# Patient Record
Sex: Female | Born: 1990 | Hispanic: No | Marital: Single | Smoking: Never smoker
Health system: Southern US, Community
[De-identification: ages and names within clinical notes are randomized; demographics above are authoritative.]

## PROBLEM LIST (undated history)

## (undated) DIAGNOSIS — E669 Obesity, unspecified: Secondary | ICD-10-CM

## (undated) HISTORY — PX: THERAPEUTIC ABORTION: SHX798

## (undated) HISTORY — DX: Obesity, unspecified: E66.9

## (undated) HISTORY — PX: TONSILLECTOMY AND ADENOIDECTOMY: SUR1326

---
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
## 2010-07-26 ENCOUNTER — Encounter (INDEPENDENT_AMBULATORY_CARE_PROVIDER_SITE_OTHER): Payer: Self-pay | Admitting: *Deleted

## 2011-01-22 NOTE — Letter (Signed)
Summary: Nadara Eaton letter  [place] at [facility]  [address] [place], [place] [address]   Phone: [phone]  Fax: [phone]       07/26/2010 MRN: [id number]  [facility] [address] [place], [place]  [address]  Dear Ms. [facility] [place] and [place] announce the retirement of Arta Silence, M.D., from full-time practice at the [facility] office effective June 21, 2010 and his plans of returning part-time.  It is important to Dr. Hetty Ely and to our practice that you understand that [facility] has seven physicians in our office for your health care needs.  We will continue to offer the same exceptional care that you have today.    Dr. Hetty Ely has spoken to many of you about his plans for retirement and returning part-time in the fall.   We will continue to work with you through the transition to schedule appointments for you in the office and meet the high standards that [place] is committed to.   Again, it is with great pleasure that we share the news that Dr. Hetty Ely will return to [facility] in October of 2011 with a reduced schedule.    If you have any questions, or would like to request an appointment with one of our physicians, please call us at [phone] and press the option for Scheduling an appointment.  We take pleasure in providing you with excellent patient care and look forward to seeing you at your next office visit.  Our [facility] Physicians are:  Tillman Abide, M.D. Laurita Quint, M.D. Roxy Manns, M.D. Kerby Nora, M.D. Hannah Beat, M.D. Ruthe Mannan, M.D. We proudly welcomed Raechel Ache, M.D. and Eustaquio Boyden, M.D. to the practice in July/August 2011.  Sincerely,  [place] [facility]

## 2012-11-26 ENCOUNTER — Encounter: Payer: Self-pay | Admitting: Obstetrics & Gynecology

## 2012-12-21 ENCOUNTER — Encounter: Payer: Self-pay | Admitting: Obstetrics & Gynecology

## 2012-12-25 ENCOUNTER — Observation Stay: Payer: Self-pay | Admitting: Obstetrics and Gynecology

## 2012-12-26 ENCOUNTER — Inpatient Hospital Stay: Payer: Self-pay

## 2015-05-02 NOTE — H&P (Signed)
L&D Evaluation:  History Expanded:   HPI 24 yo G2p0010 pt of ACHD, late entry to prenatal care, US at 35 weeks give her    Gravida 2    Term 0    PreTerm 0    Abortion 1    Living 0    Blood Type (Maternal) A positive    Group B Strep Results Maternal (Result >5wks must be treated as unknown) negative    Maternal HIV Negative    Maternal Syphilis Ab Nonreactive    Maternal Varicella Immune    Rubella Results (Maternal) immune    Maternal T-Dap Immune    [facility]    Presents with contractions    Patient's Medical History No Chronic Illness    Patient's Surgical History none    Medications Pre Natal Vitamins    Allergies NKDA    Social History none    Family History Non-Contributory   ROS:   ROS All systems were reviewed.  HEENT, CNS, GI, GU, Respiratory, CV, Renal and Musculoskeletal systems were found to be normal.   Exam:   Vital Signs stable    General no apparent distress    Mental Status clear    Chest clear    Heart normal sinus rhythm    Abdomen gravid, tender with contractions    Estimated Fetal Weight Average for gestational age    Fetal Position vertex    Back no CVAT    Pelvic no external lesions    Mebranes Intact    FHT normal rate with no decels, baseline 140 strictly reactive,    Fetal Heart Rate 140    Ucx irregular    Skin dry   Impression:   Impression latent labor   Plan:   Plan monitor contractions and for cervical change    Comments dc home iof no cervical change and return when in labor   Electronic Signatures: Adria DevonKlett, Bevin Das (MD)  (Signed 03-Jan-14 15:47)  Authored: L&D Evaluation   Last Updated: 03-Jan-14 15:47 by Adria DevonKlett, Fabiana Dromgoole (MD)

## 2015-05-02 NOTE — H&P (Signed)
L&D Evaluation:  History:   HPI 24 yo G2P0010 at 9113w3d gestational age by 35 weeks ultrasound whose pregnancy has been complicated by a late entry to care in November (EDD by ultrasound is 12/31/12), presents with regular, painful contractions.  She does not remember the exact time of onset of contractions.  She notes positive fetal movement, denies leakage of fluid, and denies vaginal bleeding, though she has had bloody show.   A+, RPR NR, RI, HBsAG neg.    Patient's Medical History No Chronic Illness    Patient's Surgical History T&A at age 89    Medications Pre [place]atal Vitamins    Allergies NKDA    Social History none    Family History Non-Contributory   ROS:   ROS All systems were reviewed.  HEENT, CNS, GI, GU, Respiratory, CV, Renal and Musculoskeletal systems were found to be normal., unless noted in HPI   Exam:   Vital Signs stable    Urine Protein not completed    General appears in distress with contractions    Mental Status clear    Chest clear    Heart normal sinus rhythm    Abdomen gravid, tender with contractions    Estimated Fetal Weight appears appropriate for gestational age    Fetal Position cephalic    Back no CVAT    Edema no edema    Pelvic 8/90/0    Mebranes Intact    FHT Description 155/+accels/variable decels/+accels    Ucx regular   Impression:   Impression active labor   Plan:   Plan EFM/NST, monitor contractions and for cervical change    Comments admit for labor AROM for clear fluid otherwise, expectant management.  Expect vaginal delivery   Electronic Signatures: Conard NovakJackson, Ronique Simerly D (MD)  (Signed 04-Jan-14 23:26)  Authored: L&D Evaluation   Last Updated: 04-Jan-14 23:26 by Conard NovakJackson, Elfego Giammarino D (MD)

## 2018-01-16 ENCOUNTER — Encounter: Payer: Self-pay | Admitting: Maternal Newborn

## 2018-01-16 ENCOUNTER — Ambulatory Visit (INDEPENDENT_AMBULATORY_CARE_PROVIDER_SITE_OTHER): Payer: BLUE CROSS/BLUE SHIELD | Admitting: Maternal Newborn

## 2018-01-16 VITALS — BP 120/90 | HR 78 | Ht 64.0 in | Wt 260.0 lb

## 2018-01-16 DIAGNOSIS — Z01419 Encounter for gynecological examination (general) (routine) without abnormal findings: Secondary | ICD-10-CM

## 2018-01-16 DIAGNOSIS — Z3009 Encounter for other general counseling and advice on contraception: Secondary | ICD-10-CM | POA: Diagnosis not present

## 2018-01-16 DIAGNOSIS — Z124 Encounter for screening for malignant neoplasm of cervix: Secondary | ICD-10-CM | POA: Diagnosis not present

## 2018-01-16 DIAGNOSIS — Z113 Encounter for screening for infections with a predominantly sexual mode of transmission: Secondary | ICD-10-CM

## 2018-01-16 NOTE — Progress Notes (Signed)
Gynecology Annual Exam  PCP: Joaquim Nam, MD  Chief Complaint:  Chief Complaint  Patient presents with  . Gynecologic Exam    periods getting more painful, heavier and longer    History of Present Illness: Patient is a 27 y.o. G1P1001 presents for annual exam. The patient has no complaints today.   LMP: Patient's last menstrual period was 12/18/2017. Average Interval: regular, 28 days Duration of flow: 5 days, used to be three days but has gotten longer in the past few years Heavy Menses: no, a little more than they were before pregnancy/dellivery Clots: no Intermenstrual Bleeding: no Postcoital Bleeding: no Dysmenorrhea: yes  The patient is not currently sexually active. She currently uses none for contraception. She denies dyspareunia.  The patient does not perform self breast exams.  There is no notable family history of breast or ovarian cancer in her family.  The patient wears seatbelts: yes.   The patient has regular exercise: no.    The patient denies current symptoms of depression.    Review of Systems  Constitutional: Negative for fever and malaise/fatigue.  HENT: Negative.   Eyes: Negative.   Respiratory: Negative for cough, shortness of breath and wheezing.   Cardiovascular: Negative for chest pain and palpitations.  Gastrointestinal: Negative for abdominal pain, diarrhea, heartburn and nausea.  Genitourinary: Negative for dysuria, frequency and urgency.  Musculoskeletal: Negative.   Skin: Negative.   Neurological: Negative for dizziness and headaches.  Endo/Heme/Allergies: Negative.   Psychiatric/Behavioral: Negative for depression. The patient is not nervous/anxious.   All other systems reviewed and are negative.   Past Medical History:  Past Medical History:  Diagnosis Date  . Obesity     Past Surgical History:  Past Surgical History:  Procedure Laterality Date  . TONSILLECTOMY AND ADENOIDECTOMY     age 69    Gynecologic History:    Patient's last menstrual period was 12/18/2017. Contraception: none Last Pap: Not on record and patient does not recall having a prior Pap.  Obstetric History: G1P1001  Family History:  Family History  Problem Relation Age of Onset  . Hypertension Maternal Grandmother   . Diabetes Maternal Grandfather   . Hypertension Maternal Grandfather   . Hyperlipidemia Maternal Grandfather     Social History:  Social History   Socioeconomic History  . Marital status: Single    Spouse name: Not on file  . Number of children: 1  . Years of education: 40  . Highest education level: Not on file  Social Needs  . Financial resource strain: Not on file  . Food insecurity - worry: Not on file  . Food insecurity - inability: Not on file  . Transportation needs - medical: Not on file  . Transportation needs - non-medical: Not on file  Occupational History  . Occupation: Chartered certified accountant: [employer]  Tobacco Use  . Smoking status: Never Smoker  . Smokeless tobacco: Never Used  Substance and Sexual Activity  . Alcohol use: No    Frequency: Never  . Drug use: No  . Sexual activity: Not Currently    Birth control/protection: None  Other Topics Concern  . Not on file  Social History Narrative  . Not on file    Allergies:  No Known Allergies  Medications: None  Physical Exam Blood pressure 120/90, pulse 78, height 5\' 4"  (1.626 m), weight 260 lb (117.9 kg), last menstrual period 12/18/2017.  General: NAD HEENT: normocephalic, anicteric Thyroid: no enlargement, no palpable  nodules Pulmonary: No increased work of breathing, CTAB Cardiovascular: RRR Breast: Breasts symmetrical, no tenderness, no palpable nodules or masses, no skin or nipple retraction present, no nipple discharge.  No axillary or supraclavicular lymphadenopathy. Abdomen: soft, non-tender, non-distended.  Umbilicus without lesions.  No hepatomegaly, splenomegaly or masses palpable. No evidence of  hernia  Genitourinary:  External: Normal external female genitalia.  Normal urethral  meatus, normal Bartholin's and Skene's glands.    Vagina: Normal vaginal mucosa, no evidence of prolapse.    Cervix: Grossly normal in appearance, no bleeding  Uterus: Non-enlarged, mobile, normal contour.  No CMT  Adnexa: ovaries non-enlarged, no adnexal masses  Rectal: deferred  Lymphatic: no evidence of inguinal lymphadenopathy Extremities: no edema, erythema, or tenderness Neurologic: Grossly intact Psychiatric: mood appropriate, affect full  Assessment: 27 y.o. G1P1001 routine annual exam.  Plan: Problem List Items Addressed This Visit    None    Visit Diagnoses    Encounter for annual routine gynecological examination    -  Primary   Pap smear for cervical cancer screening       Relevant Orders   Pap Lb, Ct-Ng TV rfx HPV ASCU   Screening examination for STD (sexually transmitted disease)       Relevant Orders   Pap Lb, Ct-Ng TV rfx HPV ASCU      1) STI screening was offered and accepted (does not want blood tests).  2) ASCCP guidelines and rationale discussed.  Patient opts for every 3 year screening interval.  3) Contraception - Education given regarding options for contraception, including barrier methods, injectable contraception, IUD placement, oral contraceptives, NuvaRing, Nexplanon, Surgical Sterilization including vasectomy. Patient not currently sexually active but desires non-hormonal method when she needs contraception again. Written information given.  4) Routine healthcare maintenance including cholesterol, diabetes screening discussed: Declines.  5) Follow up 1 year for routine annual exam.  Marcelyn BruinsJacelyn Schmid, CNM 01/16/2018  1:15 PM

## 2018-01-23 LAB — PAP LB, CT-NG TV RFX HPV ASCU
Chlamydia, Nuc. Acid Amp: NEGATIVE
GONOCOCCUS, NUC. ACID AMP: NEGATIVE
PAP Smear Comment: 0
TRICH VAG BY NAA: NEGATIVE

## 2018-07-09 ENCOUNTER — Ambulatory Visit (INDEPENDENT_AMBULATORY_CARE_PROVIDER_SITE_OTHER): Payer: BLUE CROSS/BLUE SHIELD | Admitting: Maternal Newborn

## 2018-07-09 VITALS — BP 130/80 | HR 78 | Ht 64.5 in | Wt 258.0 lb

## 2018-07-09 DIAGNOSIS — Z3009 Encounter for other general counseling and advice on contraception: Secondary | ICD-10-CM | POA: Diagnosis not present

## 2018-07-15 ENCOUNTER — Encounter: Payer: Self-pay | Admitting: Maternal Newborn

## 2018-07-15 NOTE — Progress Notes (Signed)
Obstetrics & Gynecology Office Visit   Chief Complaint:  Chief Complaint  Patient presents with  . Contraception    History of Present Illness: Patient is a 27 y.o. G1P1001 presenting for contraception consult.  She is currently on no method. No history of migraine with aura, chronic hypertension, history of DVT/PE or smoking.  Reported last menstrual period was 07/02/2018 (exact date).    Review of Systems: Review of systems negative unless otherwise noted in HPI.  Past Medical History:  Past Medical History:  Diagnosis Date  . Obesity     Past Surgical History:  Past Surgical History:  Procedure Laterality Date  . TONSILLECTOMY AND ADENOIDECTOMY     age 57    Gynecologic History: Patient's last menstrual period was 07/02/2018 (exact date).  Obstetric History: G1P1001  Family History:  Family History  Problem Relation Age of Onset  . Hypertension Maternal Grandmother   . Diabetes Maternal Grandfather   . Hypertension Maternal Grandfather   . Hyperlipidemia Maternal Grandfather     Social History:  Social History   Socioeconomic History  . Marital status: Single    Spouse name: Not on file  . Number of children: 1  . Years of education: 34  . Highest education level: Not on file  Occupational History  . Occupation: Chartered certified accountant: [employer]  Social Needs  . Financial resource strain: Not on file  . Food insecurity:    Worry: Not on file    Inability: Not on file  . Transportation needs:    Medical: Not on file    Non-medical: Not on file  Tobacco Use  . Smoking status: Never Smoker  . Smokeless tobacco: Never Used  Substance and Sexual Activity  . Alcohol use: No    Frequency: Never  . Drug use: No  . Sexual activity: Not Currently    Birth control/protection: None  Lifestyle  . Physical activity:    Days per week: Not on file    Minutes per session: Not on file  . Stress: Not on file  Relationships  . Social  connections:    Talks on phone: Not on file    Gets together: Not on file    Attends religious service: Not on file    Active member of club or organization: Not on file    Attends meetings of clubs or organizations: Not on file    Relationship status: Not on file  . Intimate partner violence:    Fear of current or ex partner: Not on file    Emotionally abused: Not on file    Physically abused: Not on file    Forced sexual activity: Not on file  Other Topics Concern  . Not on file  Social History Narrative  . Not on file    Allergies:  No Known Allergies  Medications: Prior to Admission medications   Not on File    Physical Exam Vitals:  Vitals:   07/09/18 0813  BP: 130/80  Pulse: 78   Patient's last menstrual period was 07/02/2018 (exact date).  General: NAD HEENT: normocephalic, anicteric Pulmonary: No increased work of breathing Neurologic: Grossly intact Psychiatric: mood appropriate, affect full  Assessment: 27 y.o. G1P1001 desiring contraceptive counseling.  Plan: Problem List Items Addressed This Visit    None    Visit Diagnoses    Encounter for other general counseling and advice on contraception    -  Primary     Reviewed birth  control options available including hormonal contraceptive medication (pill, patch, ring, injection, contraceptive implant) and hormonal and nonhormonal IUDs. Risks and benefits reviewed.  Questions were answered.  Information was given to patient to review. Patient is currently considering a Liletta IUD. Specific product brochure was given and placement procedure was discussed in detail. She will call to schedule placement when she has made a final decision.  A total of 15 minutes were spent in face-to-face contact with the patient during this encounter with over half of that time devoted to counseling and coordination of care.  Marcelyn BruinsJacelyn Schmid, CNM

## 2018-09-11 ENCOUNTER — Encounter: Payer: Self-pay | Admitting: Emergency Medicine

## 2018-09-11 ENCOUNTER — Other Ambulatory Visit: Payer: Self-pay

## 2018-09-11 ENCOUNTER — Emergency Department
Admission: EM | Admit: 2018-09-11 | Discharge: 2018-09-11 | Disposition: A | Discharge status: Home / Self Care | Payer: BLUE CROSS/BLUE SHIELD | Attending: Emergency Medicine | Admitting: Emergency Medicine

## 2018-09-11 ENCOUNTER — Emergency Department: Payer: BLUE CROSS/BLUE SHIELD

## 2018-09-11 DIAGNOSIS — R0781 Pleurodynia: Secondary | ICD-10-CM

## 2018-09-11 DIAGNOSIS — R079 Chest pain, unspecified: Secondary | ICD-10-CM | POA: Diagnosis present

## 2018-09-11 LAB — CBC WITH DIFFERENTIAL/PLATELET
BASOS PCT: 1 %
Basophils Absolute: 0 10*3/uL (ref 0–0.1)
EOS ABS: 0.2 10*3/uL (ref 0–0.7)
Eosinophils Relative: 2 %
HCT: 34.4 % — ABNORMAL LOW (ref 35.0–47.0)
HEMOGLOBIN: 11.2 g/dL — AB (ref 12.0–16.0)
Lymphocytes Relative: 34 %
Lymphs Abs: 3.1 10*3/uL (ref 1.0–3.6)
MCH: 23.8 pg — ABNORMAL LOW (ref 26.0–34.0)
MCHC: 32.4 g/dL (ref 32.0–36.0)
MCV: 73.5 fL — ABNORMAL LOW (ref 80.0–100.0)
MONOS PCT: 5 %
Monocytes Absolute: 0.4 10*3/uL (ref 0.2–0.9)
Neutro Abs: 5.3 10*3/uL (ref 1.4–6.5)
Neutrophils Relative %: 58 %
Platelets: 297 10*3/uL (ref 150–440)
RBC: 4.68 MIL/uL (ref 3.80–5.20)
RDW: 15.4 % — ABNORMAL HIGH (ref 11.5–14.5)
WBC: 9 10*3/uL (ref 3.6–11.0)

## 2018-09-11 LAB — COMPREHENSIVE METABOLIC PANEL
ALBUMIN: 4 g/dL (ref 3.5–5.0)
ALK PHOS: 119 U/L (ref 38–126)
ALT: 16 U/L (ref 0–44)
AST: 16 U/L (ref 15–41)
Anion gap: 6 (ref 5–15)
BUN: 10 mg/dL (ref 6–20)
CALCIUM: 8.8 mg/dL — AB (ref 8.9–10.3)
CO2: 26 mmol/L (ref 22–32)
Chloride: 105 mmol/L (ref 98–111)
Creatinine, Ser: 0.74 mg/dL (ref 0.44–1.00)
GFR calc Af Amer: >60 mL/min (ref >60)
GFR calc non Af Amer: >60 mL/min (ref >60)
GLUCOSE: 124 mg/dL — AB (ref 70–99)
POTASSIUM: 3.5 mmol/L (ref 3.5–5.1)
SODIUM: 137 mmol/L (ref 135–145)
Total Bilirubin: 0.4 mg/dL (ref 0.3–1.2)
Total Protein: 8.1 g/dL (ref 6.5–8.1)

## 2018-09-11 LAB — TROPONIN I: Troponin I: <0.03 ng/mL (ref <0.03)

## 2018-09-11 MED ORDER — HYDROCODONE-ACETAMINOPHEN 5-325 MG PO TABS: 1.0000 | ORAL_TABLET | Freq: Four times a day (QID) | ORAL | 0 refills | Status: DC | PRN

## 2018-09-11 MED ORDER — KETOROLAC TROMETHAMINE 30 MG/ML IJ SOLN
15.0000 mg | Freq: Once | INTRAMUSCULAR | Status: AC
  Administered 2018-09-11: 15 mg via INTRAVENOUS
  Filled 2018-09-11: qty 1

## 2018-09-11 MED ORDER — KETOROLAC TROMETHAMINE 30 MG/ML IJ SOLN: 30.0000 mg | Freq: Once | INTRAMUSCULAR | Status: DC

## 2018-09-11 MED ORDER — IOHEXOL 350 MG/ML SOLN
75.0000 mL | Freq: Once | INTRAVENOUS | Status: AC | PRN
  Administered 2018-09-11: 75 mL via INTRAVENOUS

## 2018-09-11 MED ORDER — ALBUTEROL SULFATE HFA 108 (90 BASE) MCG/ACT IN AERS: 2.0000 | INHALATION_SPRAY | Freq: Four times a day (QID) | RESPIRATORY_TRACT | 0 refills | Status: DC | PRN

## 2018-09-11 MED ORDER — IPRATROPIUM-ALBUTEROL 0.5-2.5 (3) MG/3ML IN SOLN
3.0000 mL | Freq: Once | RESPIRATORY_TRACT | Status: AC
  Administered 2018-09-11: 3 mL via RESPIRATORY_TRACT
  Filled 2018-09-11: qty 3

## 2018-09-11 MED ORDER — IBUPROFEN 600 MG PO TABS: 600.0000 mg | ORAL_TABLET | Freq: Three times a day (TID) | ORAL | 0 refills | Status: DC | PRN

## 2018-09-11 MED ORDER — HYDROCODONE-ACETAMINOPHEN 5-325 MG PO TABS
1.0000 | ORAL_TABLET | Freq: Once | ORAL | Status: AC
  Administered 2018-09-11: 1 via ORAL
  Filled 2018-09-11: qty 1

## 2018-09-11 MED ORDER — PREDNISONE 50 MG PO TABS: 50.0000 mg | ORAL_TABLET | Freq: Every day | ORAL | 0 refills | Status: AC

## 2018-09-11 NOTE — ED Provider Notes (Signed)
[facility] Emergency Department Provider Note  ____________________________________________   First MD Initiated Contact with Patient 09/11/18 0122     (approximate)  I have reviewed the triage vital signs and the nursing notes.   HISTORY  Chief Complaint Chest Pain   HPI Angel Cole is a 27 y.o. female self presents to the emergency department with sudden onset mild to moderate right sided sharp chest pain radiating to her right arm associated with cough and shortness of breath that began at 3 PM which is roughly 9 hours ago.  Symptoms seem to be mostly constant.  Pain is slightly worse when coughing.  Slightly worse with exertion improved with rest.  Pain is not ripping or tearing it does not go straight to her back.  No history of DVT or pulmonary embolism.   She has had some mild leg swelling.  No hemoptysis.   Past Medical History:  Diagnosis Date  . Obesity     Patient Active Problem List   Diagnosis Date Noted  . OTITIS MEDIA, LEFT 07/12/2009    Past Surgical History:  Procedure Laterality Date  . TONSILLECTOMY AND ADENOIDECTOMY     age 71    Prior to Admission medications   Medication Sig Start Date End Date Taking? Authorizing Provider  albuterol (PROVENTIL HFA;VENTOLIN HFA) 108 (90 Base) MCG/ACT inhaler Inhale 2 puffs into the lungs every 6 (six) hours as needed for wheezing or shortness of breath. 09/11/18   Merrily Brittle, MD  HYDROcodone-acetaminophen (NORCO) 5-325 MG tablet Take 1 tablet by mouth every 6 (six) hours as needed for up to 7 doses for severe pain. 09/11/18   Merrily Brittle, MD  ibuprofen (ADVIL,MOTRIN) 600 MG tablet Take 1 tablet (600 mg total) by mouth every 8 (eight) hours as needed. 09/11/18   Merrily Brittle, MD  predniSONE (DELTASONE) 50 MG tablet Take 1 tablet (50 mg total) by mouth daily for 5 days. 09/11/18 09/16/18  Merrily Brittle, MD    Allergies Patient has no known allergies.  Family History  Problem  Relation Age of Onset  . Hypertension Maternal Grandmother   . Diabetes Maternal Grandfather   . Hypertension Maternal Grandfather   . Hyperlipidemia Maternal Grandfather     Social History Social History   Tobacco Use  . Smoking status: Never Smoker  . Smokeless tobacco: Never Used  Substance Use Topics  . Alcohol use: No    Frequency: Never  . Drug use: No    Review of Systems Constitutional: No fever/chills Eyes: No visual changes. ENT: No sore throat. Cardiovascular: Positive for chest pain. Respiratory: Positive for shortness of breath. Gastrointestinal: No abdominal pain.  No nausea, no vomiting.  No diarrhea.  No constipation. Genitourinary: Negative for dysuria. Musculoskeletal: Negative for back pain. Skin: Negative for rash. Neurological: Negative for headaches, focal weakness or numbness.   ____________________________________________   PHYSICAL EXAM:  VITAL SIGNS: ED Triage Vitals  Enc Vitals Group     BP 09/11/18 0014 (!) 141/72     Pulse Rate 09/11/18 0014 (!) 102     Resp 09/11/18 0014 18     Temp 09/11/18 0014 98.4 F (36.9 C)     Temp Source 09/11/18 0014 Oral     SpO2 09/11/18 0014 99 %     Weight 09/11/18 0012 264 lb (119.7 kg)     Height 09/11/18 0012 5\' 4"  (1.626 m)     Head Circumference --      Peak Flow --  Pain Score 09/11/18 0012 7     Pain Loc --      Pain Edu? --      Excl. in GC? --     Constitutional: Alert and oriented x4 appears obviously somewhat short of breath nontoxic no diaphoresis Eyes: PERRL EOMI. Head: Atraumatic. Nose: No congestion/rhinnorhea. Mouth/Throat: No trismus Neck: No stridor.   Cardiovascular: Tachycardic rate, regular rhythm. Grossly normal heart sounds.  Good peripheral circulation. Respiratory: Increased respiratory effort with faint wheeze throughout Gastrointestinal: Obese soft nontender Musculoskeletal: Mild lower extremity edema with right leg slightly larger than the left Neurologic:   Normal speech and language. No gross focal neurologic deficits are appreciated. Skin:  Skin is warm, dry and intact. No rash noted. Psychiatric: Mood and affect are normal. Speech and behavior are normal.    ____________________________________________   DIFFERENTIAL includes but not limited to  Pulmonary embolism, pneumonia, pneumothorax, acute coronary syndrome, pericarditis, myocarditis ____________________________________________   LABS (all labs ordered are listed, but only abnormal results are displayed)  Labs Reviewed  CBC WITH DIFFERENTIAL/PLATELET - Abnormal; Notable for the following components:      Result Value   Hemoglobin 11.2 (*)    HCT 34.4 (*)    MCV 73.5 (*)    MCH 23.8 (*)    RDW 15.4 (*)    All other components within normal limits  COMPREHENSIVE METABOLIC PANEL - Abnormal; Notable for the following components:   Glucose, Bld 124 (*)    Calcium 8.8 (*)    All other components within normal limits  TROPONIN I    Lab work reviewed by me with no acute disease noted __________________________________________  EKG  ED ECG REPORT I, Merrily BrittleNeil Viktoria Gruetzmacher, the attending physician, personally viewed and interpreted this ECG.  Date: 09/13/2018 EKG Time:  Rate: 101 Rhythm: Sinus tachycardia QRS Axis: normal Intervals: normal ST/T Wave abnormalities: Nonspecific lateral T wave flattening Narrative Interpretation: no evidence of acute ischemia  ____________________________________________  RADIOLOGY  CT angiogram of the chest reviewed by me shows no evidence of pulmonary embolism but is suggestive of reactive airway disease ____________________________________________   PROCEDURES  Procedure(s) performed: yes  Angiocath insertion Performed by: Merrily BrittleNeil Zhion Pevehouse  Consent: Verbal consent obtained. Risks and benefits: risks, benefits and alternatives were discussed Time out: Immediately prior to procedure a "time out" was called to verify the correct  patient, procedure, equipment, support staff and site/side marked as required.  Preparation: Patient was prepped and draped in the usual sterile fashion.  Vein Location: right ac  Ultrasound Guided  Gauge: 20  Normal blood return and flush without difficulty Patient tolerance: Patient tolerated the procedure well with no immediate complications.     Procedures  Critical Care performed: no  ____________________________________________   INITIAL IMPRESSION / ASSESSMENT AND PLAN / ED COURSE  Pertinent labs & imaging results that were available during my care of the patient were reviewed by me and considered in my medical decision making (see chart for details).   As part of my medical decision making, I reviewed the following data within the electronic MEDICAL RECORD NUMBER History obtained from family if available, nursing notes, old chart and ekg, as well as notes from prior ED visits.  Patient arrives with pleuritic chest pain sudden in onset along with significant shortness of breath.  She has no history of PE or DVT although she is obese and rather sedentary.  She is tachycardic and therefore not PERC negative.  I do not have another clear reason for her symptoms and  we discussed CT angiogram and she agreed it was the best course of action.  CT is fortunately negative for clot however is suggestive of some reactive airway disease.  Given multiple breathing treatments with improvement in her symptoms.  I will discharge her home with a short course of albuterol and prednisone and refer her back to primary care.  Strict return precautions have been given.      ____________________________________________   FINAL CLINICAL IMPRESSION(S) / ED DIAGNOSES  Final diagnoses:  Pleuritic chest pain      NEW MEDICATIONS STARTED DURING THIS VISIT:  Discharge Medication List as of 09/11/2018  3:26 AM    START taking these medications   Details  albuterol (PROVENTIL HFA;VENTOLIN HFA)  108 (90 Base) MCG/ACT inhaler Inhale 2 puffs into the lungs every 6 (six) hours as needed for wheezing or shortness of breath., Starting Fri 09/11/2018, Print    HYDROcodone-acetaminophen (NORCO) 5-325 MG tablet Take 1 tablet by mouth every 6 (six) hours as needed for up to 7 doses for severe pain., Starting Fri 09/11/2018, Print    ibuprofen (ADVIL,MOTRIN) 600 MG tablet Take 1 tablet (600 mg total) by mouth every 8 (eight) hours as needed., Starting Fri 09/11/2018, Print    predniSONE (DELTASONE) 50 MG tablet Take 1 tablet (50 mg total) by mouth daily for 5 days., Starting Fri 09/11/2018, Until Wed 09/16/2018, Print         Note:  This document was prepared using Dragon voice recognition software and may include unintentional dictation errors.     Merrily Brittle, MD 09/13/18 1044

## 2018-09-11 NOTE — Discharge Instructions (Addendum)
Fortunately your lab work, EKG, and CT scan were reassuring.  Please take your anti inflammatory and pain medications as needed and follow up with your PMD within 1 week for a recheck.  Return to the ED sooner for any concerns whatsoever.  It was a pleasure to take care of you today, and thank you for coming to our emergency department.  If you have any questions or concerns before leaving please ask the nurse to grab me and I'm more than happy to go through your aftercare instructions again.  If you were prescribed any opioid pain medication today such as Norco, Vicodin, Percocet, morphine, hydrocodone, or oxycodone please make sure you do not drive when you are taking this medication as it can alter your ability to drive safely.  If you have any concerns once you are home that you are not improving or are in fact getting worse before you can make it to your follow-up appointment, please do not hesitate to call 911 and come back for further evaluation.  Merrily Brittle, MD  Results for orders placed or performed during the hospital encounter of 09/11/18  Troponin I  Result Value Ref Range   Troponin I <0.03 <0.03 ng/mL  CBC with Differential  Result Value Ref Range   WBC 9.0 3.6 - 11.0 K/uL   RBC 4.68 3.80 - 5.20 MIL/uL   Hemoglobin 11.2 (L) 12.0 - 16.0 g/dL   HCT 69.6 (L) 29.5 - 28.4 %   MCV 73.5 (L) 80.0 - 100.0 fL   MCH 23.8 (L) 26.0 - 34.0 pg   MCHC 32.4 32.0 - 36.0 g/dL   RDW 13.2 (H) 44.0 - 10.2 %   Platelets 297 150 - 440 K/uL   Neutrophils Relative % 58 %   Neutro Abs 5.3 1.4 - 6.5 K/uL   Lymphocytes Relative 34 %   Lymphs Abs 3.1 1.0 - 3.6 K/uL   Monocytes Relative 5 %   Monocytes Absolute 0.4 0.2 - 0.9 K/uL   Eosinophils Relative 2 %   Eosinophils Absolute 0.2 0 - 0.7 K/uL   Basophils Relative 1 %   Basophils Absolute 0.0 0 - 0.1 K/uL  Comprehensive metabolic panel  Result Value Ref Range   Sodium 137 135 - 145 mmol/L   Potassium 3.5 3.5 - 5.1 mmol/L   Chloride 105 98  - 111 mmol/L   CO2 26 22 - 32 mmol/L   Glucose, Bld 124 (H) 70 - 99 mg/dL   BUN 10 6 - 20 mg/dL   Creatinine, Ser 7.25 0.44 - 1.00 mg/dL   Calcium 8.8 (L) 8.9 - 10.3 mg/dL   Total Protein 8.1 6.5 - 8.1 g/dL   Albumin 4.0 3.5 - 5.0 g/dL   AST 16 15 - 41 U/L   ALT 16 0 - 44 U/L   Alkaline Phosphatase 119 38 - 126 U/L   Total Bilirubin 0.4 0.3 - 1.2 mg/dL   GFR calc non Af Amer >60 >60 mL/min   GFR calc Af Amer >60 >60 mL/min   Anion gap 6 5 - 15   Dg Chest 2 View  Result Date: 09/11/2018 CLINICAL DATA:  Right-sided chest pain radiating to the right arm, cough, and shortness of breath since 3 p.m. EXAM: CHEST - 2 VIEW COMPARISON:  None. FINDINGS: The heart size and mediastinal contours are within normal limits. Both lungs are clear. The visualized skeletal structures are unremarkable. IMPRESSION: No active cardiopulmonary disease. Electronically Signed   By: Marisa Cyphers.D.  On: 09/11/2018 00:50   Ct Angio Chest Pe W/cm &/or Wo Cm  Result Date: 09/11/2018 CLINICAL DATA:  Right-sided chest pain radiating to the right arm since 3 p.m. Cough and shortness of breath. EXAM: CT ANGIOGRAPHY CHEST WITH CONTRAST TECHNIQUE: Multidetector CT imaging of the chest was performed using the standard protocol during bolus administration of intravenous contrast. Multiplanar CT image reconstructions and MIPs were obtained to evaluate the vascular anatomy. CONTRAST:  75mL OMNIPAQUE IOHEXOL 350 MG/ML SOLN COMPARISON:  None. FINDINGS: Cardiovascular: Motion artifact limits examination. There is good opacification of the central and segmental pulmonary arteries. No focal filling defects. No evidence of significant pulmonary embolus. Normal caliber thoracic aorta. No aortic dissection. Normal heart size. No pericardial effusions. Mediastinum/Nodes: No significant mediastinal lymphadenopathy. Esophagus is decompressed. Lungs/Pleura: Hazy ground-glass opacities demonstrated in both lung bases with somewhat mosaic  attenuation pattern. This may represent edema, air trapping, or airways disease. No pleural effusions. No pneumothorax. Airways are patent. Upper Abdomen: No acute abnormalities identified. Musculoskeletal: No chest wall abnormality. No acute or significant osseous findings. Review of the MIP images confirms the above findings. IMPRESSION: 1. No evidence of significant pulmonary embolus. 2. Hazy ground-glass opacities in both lung bases may represent edema, air trapping, or airways disease. Electronically Signed   By: Burman NievesWilliam  Stevens M.D.   On: 09/11/2018 02:23

## 2018-09-11 NOTE — ED Triage Notes (Signed)
Pt to triage via w/c with no distress noted; reports since 3pm having right sided CP radiating into right arm accomp by cough & SHOB; denies hx of same

## 2019-01-19 ENCOUNTER — Emergency Department: Payer: BLUE CROSS/BLUE SHIELD

## 2019-01-19 ENCOUNTER — Other Ambulatory Visit: Payer: Self-pay

## 2019-01-19 ENCOUNTER — Encounter: Payer: Self-pay | Admitting: Emergency Medicine

## 2019-01-19 ENCOUNTER — Emergency Department
Admission: EM | Admit: 2019-01-19 | Discharge: 2019-01-19 | Disposition: A | Discharge status: Home / Self Care | Payer: BLUE CROSS/BLUE SHIELD | Attending: Emergency Medicine | Admitting: Emergency Medicine

## 2019-01-19 DIAGNOSIS — R1031 Right lower quadrant pain: Secondary | ICD-10-CM

## 2019-01-19 DIAGNOSIS — R599 Enlarged lymph nodes, unspecified: Secondary | ICD-10-CM | POA: Diagnosis not present

## 2019-01-19 LAB — URINALYSIS, COMPLETE (UACMP) WITH MICROSCOPIC
BILIRUBIN URINE: NEGATIVE
Bacteria, UA: NONE SEEN
GLUCOSE, UA: NEGATIVE mg/dL
HGB URINE DIPSTICK: NEGATIVE
Ketones, ur: 20 mg/dL — AB
NITRITE: NEGATIVE
Protein, ur: NEGATIVE mg/dL
SPECIFIC GRAVITY, URINE: 1.028 (ref 1.005–1.030)
pH: 5 (ref 5.0–8.0)

## 2019-01-19 LAB — POCT PREGNANCY, URINE: Preg Test, Ur: NEGATIVE

## 2019-01-19 LAB — COMPREHENSIVE METABOLIC PANEL
ALT: 17 U/L (ref 0–44)
AST: 17 U/L (ref 15–41)
Albumin: 4.1 g/dL (ref 3.5–5.0)
Alkaline Phosphatase: 133 U/L — ABNORMAL HIGH (ref 38–126)
Anion gap: 8 (ref 5–15)
BILIRUBIN TOTAL: 0.5 mg/dL (ref 0.3–1.2)
BUN: 11 mg/dL (ref 6–20)
CO2: 24 mmol/L (ref 22–32)
Calcium: 9 mg/dL (ref 8.9–10.3)
Chloride: 104 mmol/L (ref 98–111)
Creatinine, Ser: 0.66 mg/dL (ref 0.44–1.00)
Glucose, Bld: 91 mg/dL (ref 70–99)
Potassium: 3.8 mmol/L (ref 3.5–5.1)
SODIUM: 136 mmol/L (ref 135–145)
TOTAL PROTEIN: 8.5 g/dL — AB (ref 6.5–8.1)

## 2019-01-19 LAB — CBC
HEMATOCRIT: 37.5 % (ref 36.0–46.0)
Hemoglobin: 11.2 g/dL — ABNORMAL LOW (ref 12.0–15.0)
MCH: 23.3 pg — ABNORMAL LOW (ref 26.0–34.0)
MCHC: 29.9 g/dL — AB (ref 30.0–36.0)
MCV: 78.1 fL — ABNORMAL LOW (ref 80.0–100.0)
NRBC: 0 % (ref 0.0–0.2)
Platelets: 298 10*3/uL (ref 150–400)
RBC: 4.8 MIL/uL (ref 3.87–5.11)
RDW: 14.9 % (ref 11.5–15.5)
WBC: 9.4 10*3/uL (ref 4.0–10.5)

## 2019-01-19 LAB — LIPASE, BLOOD: Lipase: 27 U/L (ref 11–51)

## 2019-01-19 MED ORDER — SODIUM CHLORIDE 0.9% FLUSH: 3.0000 mL | Freq: Once | INTRAVENOUS | Status: DC

## 2019-01-19 MED ORDER — NAPROXEN 500 MG PO TABS: 500.0000 mg | ORAL_TABLET | Freq: Two times a day (BID) | ORAL | 2 refills | Status: DC

## 2019-01-19 NOTE — ED Triage Notes (Signed)
C/O right flank and RLQ abdominal pain x 5 days.  Denies intermittent nausea, denies emesis and diarrhea.  Referred to ED for evaluation by PCP (occupational health physician).  Patient had blood work and urine done yesterday at PCP, also started on Macrodantin.

## 2019-01-19 NOTE — ED Provider Notes (Signed)
[facility] Emergency Department Provider Note   ____________________________________________    I have reviewed Angel triage vital signs and Angel nursing notes.   HISTORY  Chief Complaint Abdominal Pain     HPI Angel Cole is a 28 y.o. female who presents with complaints of right lower quadrant abdominal pain.  Patient reports Angel pain is been ongoing for approximately 4 Cole, Angel pain is been constant but waxes and wanes in intensity.  She denies fevers or chills.  No nausea or vomiting.  No diarrhea.  Sent by her PCP for evaluation.  No history of kidney stones although her sister has had them.  Denies dysuria.  No history of abdominal surgery  Past Medical History:  Diagnosis Date  . Obesity     Patient Active Problem List   Diagnosis Date Noted  . OTITIS MEDIA, LEFT 07/12/2009    Past Surgical History:  Procedure Laterality Date  . THERAPEUTIC ABORTION    . TONSILLECTOMY AND ADENOIDECTOMY     age 9    Prior to Admission medications   Medication Sig Start Date End Date Taking? Authorizing Provider  albuterol (PROVENTIL HFA;VENTOLIN HFA) 108 (90 Base) MCG/ACT inhaler Inhale 2 puffs into Angel lungs every 6 (six) hours as needed for wheezing or shortness of breath. 09/11/18   Merrily Brittle, MD  HYDROcodone-acetaminophen (NORCO) 5-325 MG tablet Take 1 tablet by mouth every 6 (six) hours as needed for up to 7 doses for severe pain. 09/11/18   Merrily Brittle, MD  ibuprofen (ADVIL,MOTRIN) 600 MG tablet Take 1 tablet (600 mg total) by mouth every 8 (eight) hours as needed. 09/11/18   Merrily Brittle, MD  naproxen (NAPROSYN) 500 MG tablet Take 1 tablet (500 mg total) by mouth 2 (two) times daily with a meal. 01/19/19   Jene Every, MD     Allergies Patient has no known allergies.  Family History  Problem Relation Age of Onset  . Hypertension Maternal Grandmother   . Diabetes Maternal Grandfather   . Hypertension Maternal Grandfather   .  Hyperlipidemia Maternal Grandfather     Social History Social History   Tobacco Use  . Smoking status: Never Smoker  . Smokeless tobacco: Never Used  Substance Use Topics  . Alcohol use: No    Frequency: Never  . Drug use: No    Review of Systems  Constitutional: No fever/chills Eyes: No visual changes.  ENT: No sore throat. Cardiovascular: Denies chest pain. Respiratory: Denies shortness of breath. Gastrointestinal: As above Genitourinary: Negative for dysuria.  No hematuria noted Musculoskeletal: Negative for back pain. Skin: Negative for rash. Neurological: Negative for headaches   ____________________________________________   PHYSICAL EXAM:  VITAL SIGNS: ED Triage Vitals  Enc Vitals Group     BP 01/19/19 1504 (!) 118/55     Pulse Rate 01/19/19 1504 86     Resp 01/19/19 1504 16     Temp 01/19/19 1504 98.3 F (36.8 C)     Temp Source 01/19/19 1504 Oral     SpO2 01/19/19 1504 99 %     Weight 01/19/19 1502 122.5 kg (270 lb)     Height 01/19/19 1502 1.651 m (5\' 5" )     Head Circumference --      Peak Flow --      Pain Score 01/19/19 1502 6     Pain Loc --      Pain Edu? --      Excl. in GC? --     Constitutional:  Alert and oriented.  Eyes: Conjunctivae are normal.   Nose: No congestion/rhinnorhea. Mouth/Throat: Mucous membranes are moist.    Cardiovascular: Normal rate, regular rhythm. Grossly normal heart sounds.  Good peripheral circulation. Respiratory: Normal respiratory effort.  No retractions. Lungs CTAB. Gastrointestinal: significant tenderness palpation Angel right lower quadrant at McBurney's point. No distention.  No CVA tenderness.  Musculoskeletal: No lower extremity tenderness nor edema.  Warm and well perfused Neurologic:  Normal speech and language. No gross focal neurologic deficits are appreciated.  Skin:  Skin is warm, dry and intact. No rash noted. Psychiatric: Mood and affect are normal. Speech and behavior are  normal.  ____________________________________________   LABS (all labs ordered are listed, but only abnormal results are displayed)  Labs Reviewed  COMPREHENSIVE METABOLIC PANEL - Abnormal; Notable for Angel following components:      Result Value   Total Protein 8.5 (*)    Alkaline Phosphatase 133 (*)    All other components within normal limits  CBC - Abnormal; Notable for Angel following components:   Hemoglobin 11.2 (*)    MCV 78.1 (*)    MCH 23.3 (*)    MCHC 29.9 (*)    All other components within normal limits  URINALYSIS, COMPLETE (UACMP) WITH MICROSCOPIC - Abnormal; Notable for Angel following components:   Color, Urine YELLOW (*)    APPearance CLEAR (*)    Ketones, ur 20 (*)    Leukocytes, UA TRACE (*)    All other components within normal limits  LIPASE, BLOOD  POC URINE PREG, ED  POCT PREGNANCY, URINE   ____________________________________________  EKG  None ____________________________________________  RADIOLOGY  T renal stone study ____________________________________________   PROCEDURES  Procedure(s) performed: No  Procedures   Critical Care performed: No ____________________________________________   INITIAL IMPRESSION / ASSESSMENT AND PLAN / ED COURSE  Pertinent labs & imaging results that were available during my care of Angel patient were reviewed by me and considered in my medical decision making (see chart for details).  Overall well-appearing and in no acute distress however she does have significant tenderness in Angel right lower quadrant this is been ongoing for 4 Cole, which seems inconsistent with appendicitis however given her level of tenderness we will obtain imaging, differential also includes ureterolithiasis or urinary tract infection.  Thus far lab work is reassuring.  Urinalysis overall reassuring  CT scan demonstrates likely enlarged lymph node, normal appendix.  Discussed this at length with patient and Angel need for follow-up  imaging to ensure improvement    ____________________________________________   FINAL CLINICAL IMPRESSION(S) / ED DIAGNOSES  Final diagnoses:  Right lower quadrant abdominal pain  Lymph node enlargement        Note:  This document was prepared using Dragon voice recognition software and may include unintentional dictation errors.   Jene Every, MD 01/19/19 [phone]

## 2019-01-19 NOTE — Discharge Instructions (Signed)
Please follow up with your doctor to make sure you have repeat imaging of the lymph node seen on your CT scan

## 2019-03-04 ENCOUNTER — Ambulatory Visit (INDEPENDENT_AMBULATORY_CARE_PROVIDER_SITE_OTHER): Payer: BC Managed Care – PPO | Admitting: Maternal Newborn

## 2019-03-04 ENCOUNTER — Other Ambulatory Visit (HOSPITAL_COMMUNITY)
Admission: RE | Admit: 2019-03-04 | Discharge: 2019-03-04 | Disposition: A | Discharge status: Home / Self Care | Payer: BC Managed Care – PPO | Source: Ambulatory Visit | Attending: Maternal Newborn | Admitting: Maternal Newborn

## 2019-03-04 ENCOUNTER — Other Ambulatory Visit: Payer: Self-pay

## 2019-03-04 ENCOUNTER — Encounter: Payer: Self-pay | Admitting: Maternal Newborn

## 2019-03-04 VITALS — BP 128/70 | HR 109 | Ht 65.0 in | Wt 274.0 lb

## 2019-03-04 DIAGNOSIS — Z113 Encounter for screening for infections with a predominantly sexual mode of transmission: Secondary | ICD-10-CM | POA: Diagnosis present

## 2019-03-04 DIAGNOSIS — R109 Unspecified abdominal pain: Secondary | ICD-10-CM | POA: Diagnosis present

## 2019-03-04 DIAGNOSIS — Z01419 Encounter for gynecological examination (general) (routine) without abnormal findings: Secondary | ICD-10-CM

## 2019-03-04 NOTE — Progress Notes (Signed)
Gynecology Annual Exam  PCP: System, Provider Not In  Chief Complaint:  Chief Complaint  Patient presents with  . Gynecologic Exam    RLQ pain x2 months, pain has gotten better    History of Present Illness: Patient is a 28 y.o. G1P1001 presenting for an annual exam. The patient has no complaints today. She was having RLQ pain, which was evaluated by her PCP and in the ED on 01/19/2019. It has since resolved.  LMP: Patient's last menstrual period was 02/06/2019 (approximate). Average Interval: regular, 28 days Duration of flow: 5 days Heavy Menses: no Clots: no Intermenstrual Bleeding: no Postcoital Bleeding: no Dysmenorrhea: yes  The patient is sexually active. She currently uses no method for contraception. She does not have dyspareunia.  The patient does perform self breast exams.  There is no notable family history of breast or ovarian cancer in her family.  The patient wears seatbelts: yes.   The patient has regular exercise: no.    The patient does not have current symptoms of depression.  Domestic violence screening is negative.   Review of Systems  Constitutional: Negative.   HENT: Negative.   Eyes: Negative.   Respiratory: Negative for shortness of breath and wheezing.   Cardiovascular: Negative for chest pain and palpitations.  Gastrointestinal: Negative for abdominal pain and nausea.  Genitourinary: Negative.   Skin: Negative.   Neurological: Negative.   Endo/Heme/Allergies: Negative.   Psychiatric/Behavioral: Negative.   All other systems reviewed and are negative.   Past Medical History:  Past Medical History:  Diagnosis Date  . Obesity     Past Surgical History:  Past Surgical History:  Procedure Laterality Date  . THERAPEUTIC ABORTION    . TONSILLECTOMY AND ADENOIDECTOMY     age 89    Gynecologic History:  Patient's last menstrual period was 02/06/2019 (approximate). Contraception: none Last Pap: 01/16/2018.  Results were: NILM   Obstetric History: G1P1001  Family History:  Family History  Problem Relation Age of Onset  . Hypertension Maternal Grandmother   . Diabetes Maternal Grandfather   . Hypertension Maternal Grandfather   . Hyperlipidemia Maternal Grandfather     Social History:  Social History   Socioeconomic History  . Marital status: Single    Spouse name: Not on file  . Number of children: 1  . Years of education: 5  . Highest education level: Not on file  Occupational History  . Occupation: Chartered certified accountant: [employer]  Social Needs  . Financial resource strain: Not on file  . Food insecurity:    Worry: Not on file    Inability: Not on file  . Transportation needs:    Medical: Not on file    Non-medical: Not on file  Tobacco Use  . Smoking status: Never Smoker  . Smokeless tobacco: Never Used  Substance and Sexual Activity  . Alcohol use: No    Frequency: Never  . Drug use: No  . Sexual activity: Not Currently    Birth control/protection: None  Lifestyle  . Physical activity:    Days per week: Not on file    Minutes per session: Not on file  . Stress: Not on file  Relationships  . Social connections:    Talks on phone: Not on file    Gets together: Not on file    Attends religious service: Not on file    Active member of club or organization: Not on file    Attends meetings  of clubs or organizations: Not on file    Relationship status: Not on file  . Intimate partner violence:    Fear of current or ex partner: Not on file    Emotionally abused: Not on file    Physically abused: Not on file    Forced sexual activity: Not on file  Other Topics Concern  . Not on file  Social History Narrative  . Not on file    Allergies:  No Known Allergies  Medications: Prior to Admission medications   Medication Sig Start Date End Date Taking? Authorizing Provider  albuterol (PROVENTIL HFA;VENTOLIN HFA) 108 (90 Base) MCG/ACT inhaler Inhale 2 puffs into the  lungs every 6 (six) hours as needed for wheezing or shortness of breath. Patient not taking: Reported on 03/04/2019 09/11/18   Merrily Brittle, MD  naproxen (NAPROSYN) 500 MG tablet Take 1 tablet (500 mg total) by mouth 2 (two) times daily with a meal. Patient not taking: Reported on 03/04/2019 01/19/19   Jene Every, MD    Physical Exam Vitals: Blood pressure 128/70, pulse (!) 109, height 5\' 5"  (1.651 m), weight 274 lb (124.3 kg), last menstrual period 02/06/2019.  General: NAD HEENT: normocephalic, anicteric Thyroid: no enlargement, no palpable nodules Pulmonary: No increased work of breathing, CTAB Cardiovascular: RRR, no murmurs, rubs or gallops Breast: Breasts symmetrical, no tenderness, no palpable nodules or masses, no skin or nipple retraction present, no nipple discharge.  No axillary or supraclavicular lymphadenopathy. Abdomen: Soft, non-tender, non-distended.  Umbilicus without lesions.  No hepatomegaly, splenomegaly or masses palpable. No evidence of hernia  Genitourinary:  External: Normal external female genitalia.  Normal urethral  meatus, normal Bartholin's and Skene's glands.    Vagina: Normal vaginal mucosa, no evidence of prolapse.    Cervix: Grossly normal in appearance, no bleeding  Uterus: Non-enlarged, mobile, normal contour.  No CMT  Adnexa: ovaries non-enlarged, no adnexal masses  Rectal: deferred  Lymphatic: no evidence of inguinal lymphadenopathy Extremities: no edema, erythema, or tenderness Neurologic: Grossly intact Psychiatric: mood appropriate, affect full  Assessment: 28 y.o. G1P1001 routine annual exam  Plan: Problem List Items Addressed This Visit    None    Visit Diagnoses    Encounter for annual routine gynecological examination    -  Primary   Screen for STD (sexually transmitted disease)       Relevant Orders   Cervicovaginal ancillary only   Right sided abdominal pain       Relevant Orders   Cervicovaginal ancillary only      1) STI  screening was offered and accepted  2) ASCCP guidelines and rationale discussed.  Patient opts for every 3 year screening interval  3) Contraception - Revisited discussion about IUD. She is still interested in [place], but her insurance company told her that it was not covered. Discussed alternative devices available. Gave IUD brochures and list of IUD names/codes so she can see if another IUD would be covered  4) Routine healthcare maintenance including cholesterol, diabetes screening: Declines  5) No findings of physical exam that suggest a gynecological cause for recent episode of RLQ pain. Discussed that an ovarian cyst could possibly cause such unilateral pain, and that presence of non-palpable cysts can only be determined by ultrasound. Testing today to rule out infectious causes. She will be following up on the RLQ imaging done in January which showed a possible enlarged lymph node. Would not recommend gyn ultrasound unless pain returns.  6) Follow up 1 year for an annual exam.  Marcelyn Bruins, CNM 03/04/2019

## 2019-03-04 NOTE — Patient Instructions (Signed)
Preventive Care 18-39 Years, Female Preventive care refers to lifestyle choices and visits with your health care provider that can promote health and wellness. What does preventive care include?   A yearly physical exam. This is also called an annual well check.  Dental exams once or twice a year.  Routine eye exams. Ask your health care provider how often you should have your eyes checked.  Personal lifestyle choices, including: ? Daily care of your teeth and gums. ? Regular physical activity. ? Eating a healthy diet. ? Avoiding tobacco and drug use. ? Limiting alcohol use. ? Practicing safe sex. ? Taking vitamin and mineral supplements as recommended by your health care provider. What happens during an annual well check? The services and screenings done by your health care provider during your annual well check will depend on your age, overall health, lifestyle risk factors, and family history of disease. Counseling Your health care provider may ask you questions about your:  Alcohol use.  Tobacco use.  Drug use.  Emotional well-being.  Home and relationship well-being.  Sexual activity.  Eating habits.  Work and work environment.  Method of birth control.  Menstrual cycle.  Pregnancy history. Screening You may have the following tests or measurements:  Height, weight, and BMI.  Diabetes screening. This is done by checking your blood sugar (glucose) after you have not eaten for a while (fasting).  Blood pressure.  Lipid and cholesterol levels. These may be checked every 5 years starting at age 20.  Skin check.  Hepatitis C blood test.  Hepatitis B blood test.  Sexually transmitted disease (STD) testing.  BRCA-related cancer screening. This may be done if you have a family history of breast, ovarian, tubal, or peritoneal cancers.  Pelvic exam and Pap test. This may be done every 3 years starting at age 21. Starting at age 30, this may be done every 5  years if you have a Pap test in combination with an HPV test. Discuss your test results, treatment options, and if necessary, the need for more tests with your health care provider. Vaccines Your health care provider may recommend certain vaccines, such as:  Influenza vaccine. This is recommended every year.  Tetanus, diphtheria, and acellular pertussis (Tdap, Td) vaccine. You may need a Td booster every 10 years.  Varicella vaccine. You may need this if you have not been vaccinated.  HPV vaccine. If you are 26 or younger, you may need three doses over 6 months.  Measles, mumps, and rubella (MMR) vaccine. You may need at least one dose of MMR. You may also need a second dose.  Pneumococcal 13-valent conjugate (PCV13) vaccine. You may need this if you have certain conditions and were not previously vaccinated.  Pneumococcal polysaccharide (PPSV23) vaccine. You may need one or two doses if you smoke cigarettes or if you have certain conditions.  Meningococcal vaccine. One dose is recommended if you are age 19-21 years and a first-year college student living in a residence hall, or if you have one of several medical conditions. You may also need additional booster doses.  Hepatitis A vaccine. You may need this if you have certain conditions or if you travel or work in places where you may be exposed to hepatitis A.  Hepatitis B vaccine. You may need this if you have certain conditions or if you travel or work in places where you may be exposed to hepatitis B.  Haemophilus influenzae type b (Hib) vaccine. You may need this if you   have certain risk factors. Talk to your health care provider about which screenings and vaccines you need and how often you need them. This information is not intended to replace advice given to you by your health care provider. Make sure you discuss any questions you have with your health care provider. Document Released: 02/04/2002 Document Revised: 07/22/2017  Document Reviewed: 10/10/2015 Elsevier Interactive Patient Education  2019 [employer].

## 2019-03-08 LAB — CERVICOVAGINAL ANCILLARY ONLY
BACTERIAL VAGINITIS: NEGATIVE
CANDIDA VAGINITIS: NEGATIVE
Chlamydia: NEGATIVE
Neisseria Gonorrhea: NEGATIVE
Trichomonas: NEGATIVE

## 2019-11-22 DIAGNOSIS — J019 Acute sinusitis, unspecified: Secondary | ICD-10-CM | POA: Diagnosis not present

## 2019-11-22 DIAGNOSIS — R6889 Other general symptoms and signs: Secondary | ICD-10-CM | POA: Diagnosis not present

## 2019-11-22 DIAGNOSIS — M791 Myalgia, unspecified site: Secondary | ICD-10-CM | POA: Diagnosis not present

## 2019-11-22 DIAGNOSIS — Z03818 Encounter for observation for suspected exposure to other biological agents ruled out: Secondary | ICD-10-CM | POA: Diagnosis not present

## 2020-01-25 ENCOUNTER — Encounter: Payer: Self-pay | Admitting: Obstetrics and Gynecology

## 2020-01-25 ENCOUNTER — Ambulatory Visit (INDEPENDENT_AMBULATORY_CARE_PROVIDER_SITE_OTHER): Payer: Managed Care, Other (non HMO)

## 2020-01-25 ENCOUNTER — Ambulatory Visit (INDEPENDENT_AMBULATORY_CARE_PROVIDER_SITE_OTHER): Payer: Managed Care, Other (non HMO) | Admitting: Obstetrics and Gynecology

## 2020-01-25 ENCOUNTER — Other Ambulatory Visit (HOSPITAL_COMMUNITY)
Admission: RE | Admit: 2020-01-25 | Discharge: 2020-01-25 | Disposition: A | Discharge status: Home / Self Care | Payer: Managed Care, Other (non HMO) | Source: Ambulatory Visit | Attending: Obstetrics and Gynecology | Admitting: Obstetrics and Gynecology

## 2020-01-25 ENCOUNTER — Other Ambulatory Visit: Payer: Self-pay

## 2020-01-25 VITALS — BP 130/84 | Ht 65.0 in | Wt 305.0 lb

## 2020-01-25 DIAGNOSIS — Z3202 Encounter for pregnancy test, result negative: Secondary | ICD-10-CM | POA: Diagnosis not present

## 2020-01-25 DIAGNOSIS — N925 Other specified irregular menstruation: Secondary | ICD-10-CM

## 2020-01-25 DIAGNOSIS — R102 Pelvic and perineal pain: Secondary | ICD-10-CM

## 2020-01-25 DIAGNOSIS — Z113 Encounter for screening for infections with a predominantly sexual mode of transmission: Secondary | ICD-10-CM

## 2020-01-25 DIAGNOSIS — N926 Irregular menstruation, unspecified: Secondary | ICD-10-CM

## 2020-01-25 DIAGNOSIS — R69 Illness, unspecified: Secondary | ICD-10-CM | POA: Diagnosis not present

## 2020-01-25 LAB — POCT URINE PREGNANCY: Preg Test, Ur: NEGATIVE

## 2020-01-25 NOTE — Patient Instructions (Signed)
I value your feedback and entrusting us with your care. If you get a [facility] patient survey, I would appreciate you taking the time to let us know about your experience today. Thank you!  As of December 02, 2019, your lab results will be released to your MyChart immediately, before I even have a chance to see them. Please give me time to review them and contact you if there are any abnormalities. Thank you for your patience.  

## 2020-01-25 NOTE — Progress Notes (Signed)
System, Provider Not In   Chief Complaint  Patient presents with   Pelvic Pain    center of pelvic area for the last two weeks, no uti sx, no abnormal bleeding    HPI:      [name] is a 29 y.o. G1P1001 who LMP was Patient's last menstrual period was 12/01/2019 (exact date)., presents today for eval of pelvic pain for the past 1-2 wks. Pain is dull, intermittent, no aggravating factors. Last a few hrs. Doesn't like taking meds so hasn't taken anything, no heating pad. No urin, GI, vag sx. No LBP, fevers. No hx of ovar cysts. Hx of enlarged lymph node RT ext iliac region on CT 1/20; suggested u/s in 1 yr, although may not be able to be seen.   She was last sex active 11/20 with new partner, used condoms. No recent STD testing.  Menses are monthly, last 5 days, no BTB, mod dysmen, no meds taken. Didn't have 12/20 period and had 6 neg UPTs. Pt concerned she could be pregnant even though they were neg. Pt has had 30# wt gain since 3/20 appt (only 3# since 11/20 appt), and increased stress in Dec. Also had covid 12/20. Is doing diet changes for wt loss.    Past Medical History:  Diagnosis Date   Obesity     Past Surgical History:  Procedure Laterality Date   THERAPEUTIC ABORTION     TONSILLECTOMY AND ADENOIDECTOMY     age 30    Family History  Problem Relation Age of Onset   Hypertension Maternal Grandmother    Diabetes Maternal Grandfather    Hypertension Maternal Grandfather    Hyperlipidemia Maternal Grandfather     Social History   Socioeconomic History   Marital status: Single    Spouse name: Not on file   Number of children: 1   Years of education: 14   Highest education level: Not on file  Occupational History   Occupation: Government social research officer    Employer: [employer]  Tobacco Use   Smoking status: Never Smoker   Smokeless tobacco: Never Used  Substance and Sexual Activity   Alcohol use: No   Drug use: No   Sexual activity:  Not Currently    Birth control/protection: None  Other Topics Concern   Not on file  Social History Narrative   Not on file   Social Determinants of Health   Financial Resource Strain:    Difficulty of Paying Living Expenses: Not on file  Food Insecurity:    Worried About Programme researcher, broadcasting/film/video in the Last Year: Not on file   [employer] of Food in the Last Year: Not on file  Transportation Needs:    Lack of Transportation (Medical): Not on file   Lack of Transportation (Non-Medical): Not on file  Physical Activity:    Days of Exercise per Week: Not on file   Minutes of Exercise per Session: Not on file  Stress:    Feeling of Stress : Not on file  Social Connections:    Frequency of Communication with Friends and Family: Not on file   Frequency of Social Gatherings with Friends and Family: Not on file   Attends Religious Services: Not on file   Active Member of Clubs or Organizations: Not on file   Attends Banker Meetings: Not on file   Marital Status: Not on file  Intimate Partner Violence:    Fear of Current or Ex-Partner: Not  on file   Emotionally Abused: Not on file   Physically Abused: Not on file   Sexually Abused: Not on file    Outpatient Medications Prior to Visit  Medication Sig Dispense Refill   albuterol (PROVENTIL HFA;VENTOLIN HFA) 108 (90 Base) MCG/ACT inhaler Inhale 2 puffs into the lungs every 6 (six) hours as needed for wheezing or shortness of breath. (Patient not taking: Reported on 03/04/2019) 1 Inhaler 0   naproxen (NAPROSYN) 500 MG tablet Take 1 tablet (500 mg total) by mouth 2 (two) times daily with a meal. (Patient not taking: Reported on 03/04/2019) 20 tablet 2   No facility-administered medications prior to visit.      ROS:  Review of Systems  Constitutional: Negative for fatigue, fever and unexpected weight change.  Respiratory: Positive for shortness of breath. Negative for cough and wheezing.   Cardiovascular:  Negative for chest pain, palpitations and leg swelling.  Gastrointestinal: Negative for blood in stool, constipation, diarrhea, nausea and vomiting.  Endocrine: Negative for cold intolerance, heat intolerance and polyuria.  Genitourinary: Positive for menstrual problem. Negative for dyspareunia, dysuria, flank pain, frequency, genital sores, hematuria, pelvic pain, urgency, vaginal bleeding, vaginal discharge and vaginal pain.  Musculoskeletal: Negative for back pain, joint swelling and myalgias.  Skin: Negative for rash.  Neurological: Negative for dizziness, syncope, light-headedness, numbness and headaches.  Hematological: Negative for adenopathy.  Psychiatric/Behavioral: Positive for agitation. Negative for confusion, sleep disturbance and suicidal ideas. The patient is not nervous/anxious.   BREAST: No symptoms   OBJECTIVE:   Vitals:  BP 130/84    Ht 5\' 5"  (1.651 m)    Wt (!) 305 lb (138.3 kg)    LMP 12/01/2019 (Exact Date)    BMI 50.75 kg/m   Physical Exam Vitals reviewed.  Constitutional:      Appearance: She is well-developed.  Pulmonary:     Effort: Pulmonary effort is normal.  Abdominal:     Palpations: Abdomen is soft.     Tenderness: There is no abdominal tenderness.  Genitourinary:    General: Normal vulva.     Pubic Area: No rash.      Labia:        Right: No rash, tenderness or lesion.        Left: No rash, tenderness or lesion.      Vagina: Normal. No vaginal discharge, erythema or tenderness.     Cervix: Normal.     Uterus: Normal. Not enlarged and not tender.      Adnexa: Right adnexa normal and left adnexa normal.       Right: No mass or tenderness.         Left: No mass or tenderness.    Musculoskeletal:        General: Normal range of motion.     Cervical back: Normal range of motion.  Skin:    General: Skin is warm and dry.  Neurological:     General: No focal deficit present.     Mental Status: She is alert and oriented to person, place, and time.    Psychiatric:        Mood and Affect: Mood normal.        Behavior: Behavior normal.        Thought Content: Thought content normal.        Judgment: Judgment normal.     Results: Results for orders placed or performed in visit on 01/25/20 (from the past 24 hour(s))  POCT urine pregnancy  Status: Normal   Collection Time: 01/25/20  5:17 PM  Result Value Ref Range   Preg Test, Ur Negative Negative   ULTRASOUND REPORT  Location: [facility]  Date of Service: 01/25/2020     Indications:Pelvic Pain Findings:  The uterus is retroverted and measures 6.4 x 5.3 x 4.2 cm. Echo texture is homogenous without evidence of focal masses. The Endometrium measures 10.0 mm.  Right Ovary measures 3.6 x 2.0 x 2.1 cm. It is normal in appearance. Left Ovary measures 3.5 x 2.3 x 2.1 cm. It is normal in appearance. Survey of the adnexa demonstrates no adnexal masses. There is no free fluid in the cul de sac.  Impression: 1. Normal pelvic ultrasound.   Recommendations: 1.Clinical correlation with the patient's History and Physical Exam.  Gweneth Dimitri, RT   Review of ULTRASOUND.    I have personally reviewed images and report of recent ultrasound done at [facility].    Plan of management to be discussed with pa Reika Callanan  Barnett Applebaum, MD, [facility], [facility] 01/26/2020  1:26 PM   Assessment/Plan: Pelvic pain - Plan: Cervicovaginal ancillary only, US PELVIS TRANSVAGINAL NON-OB (TV ONLY); Neg exam. Neg GYN u/s. Most likely will feel better after menses. F/u if no sx improvement after menses of if doesn't get menses.   Late menses - Plan: POCT urine pregnancy; Neg UPT. Most likely due to stress/wt change.   Screening for STD (sexually transmitted disease) - Plan: Cervicovaginal ancillary only     Return if symptoms worsen or fail to improve.  Taima Rada B. Laranda Burkemper, PA-C 01/25/2020 5:17 PM

## 2020-01-27 ENCOUNTER — Encounter: Payer: Self-pay | Admitting: Obstetrics and Gynecology

## 2020-01-27 LAB — CERVICOVAGINAL ANCILLARY ONLY
Chlamydia: NEGATIVE
Comment: NEGATIVE
Comment: NORMAL
Neisseria Gonorrhea: NEGATIVE

## 2020-03-01 IMAGING — CT CT ANGIO CHEST
2 of 6 series · 19 of 36 positions shown · IV contrast (APPLIED)
Comparison: None.

CLINICAL DATA: Right-sided chest pain radiating to the right arm
since 3 p.m.. Cough and shortness of breath.

EXAM:
CT ANGIOGRAPHY CHEST WITH CONTRAST
TECHNIQUE: Multidetector CT imaging of the chest was performed using the
standard protocol during bolus administration of intravenous
contrast. Multiplanar CT image reconstructions and MIPs were
obtained to evaluate the vascular anatomy.
CONTRAST:  75mL OMNIPAQUE IOHEXOL 350 MG/ML SOLN

[Series 5: thins · axial · 0.64mm/px · z∈[-442,-224]mm · 18 of 244 slices shown]
[im 13/244  lung]
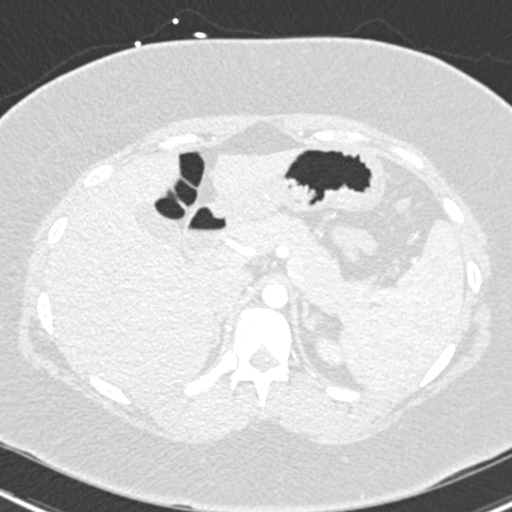
[im 25/244  mediastinal]
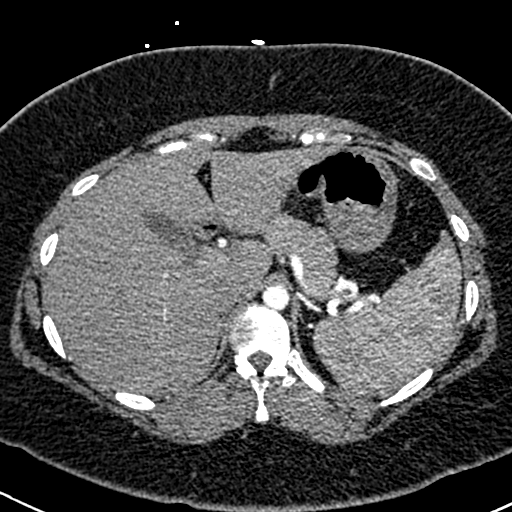
[im 37/244  lung]
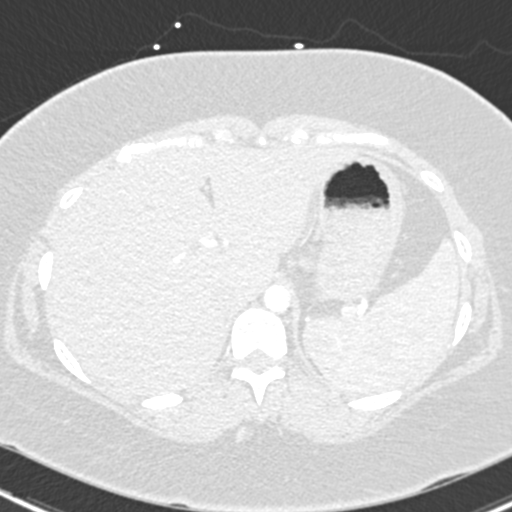
[im 49/244  mediastinal]
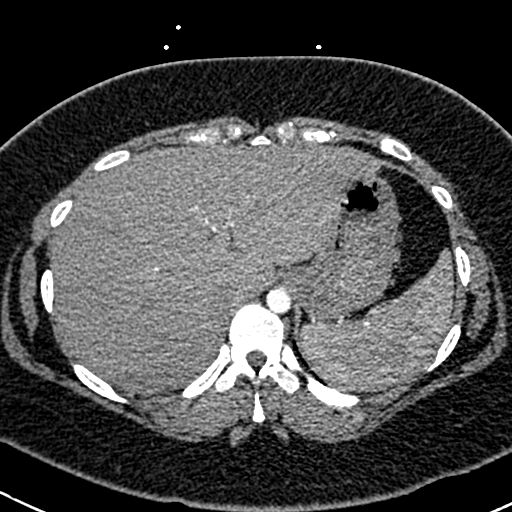
[im 61/244  lung]
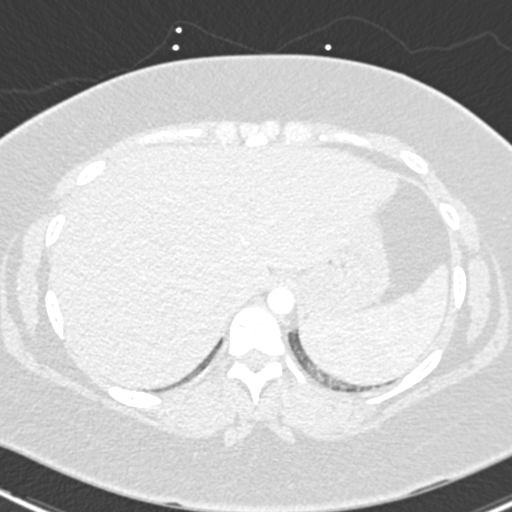
[im 73/244  mediastinal]
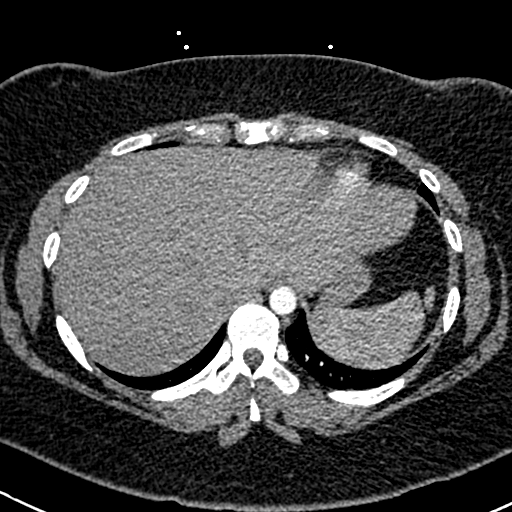
[im 86/244  lung]
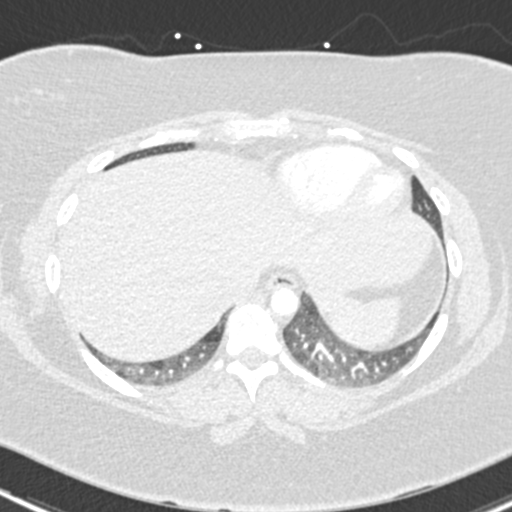
[im 98/244  mediastinal]
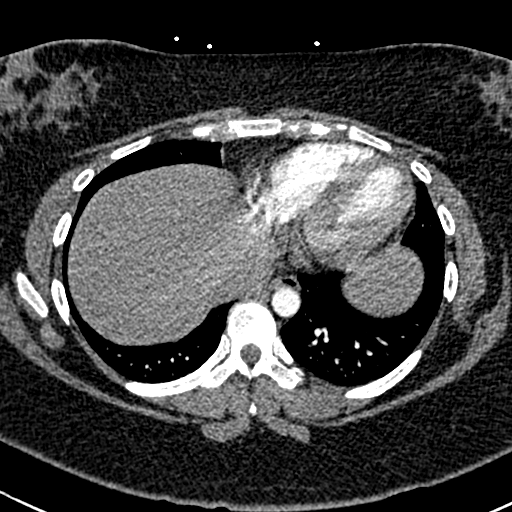
[im 110/244  lung]
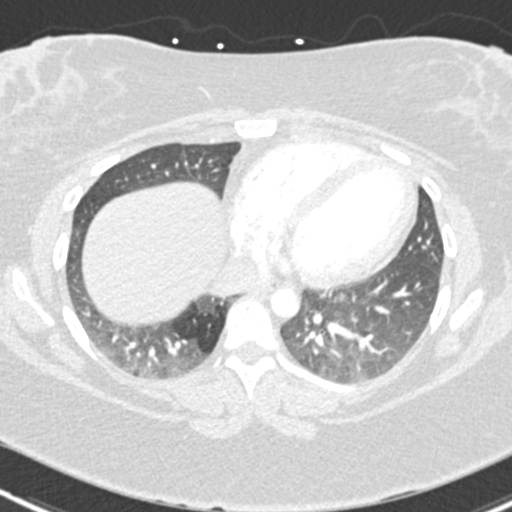
[im 134/244  mediastinal]
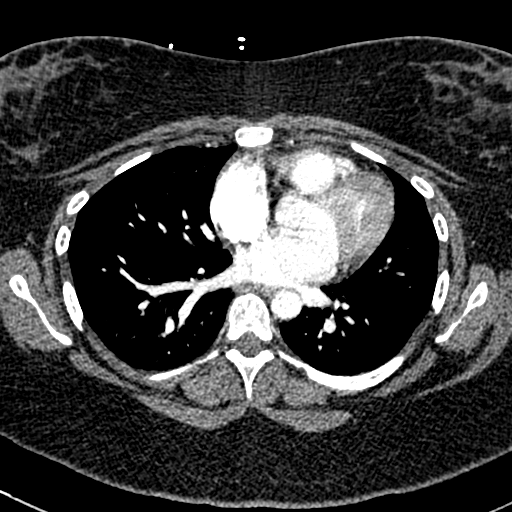
[im 146/244  lung]
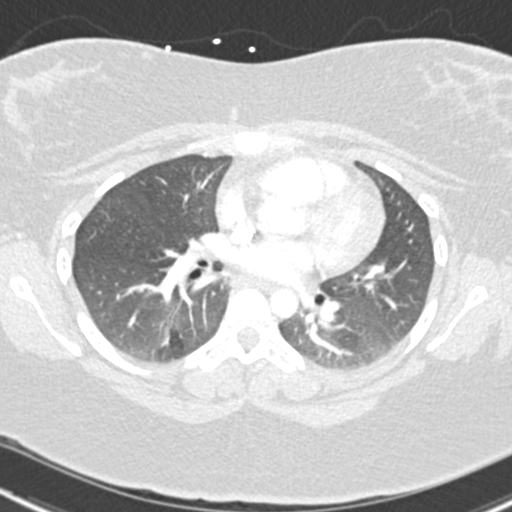
[im 158/244  mediastinal]
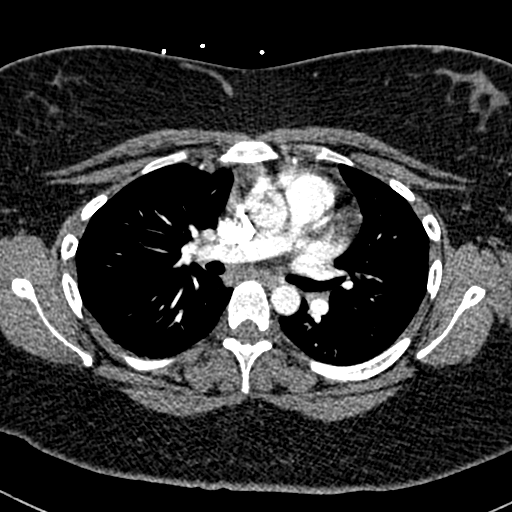
[im 171/244  lung]
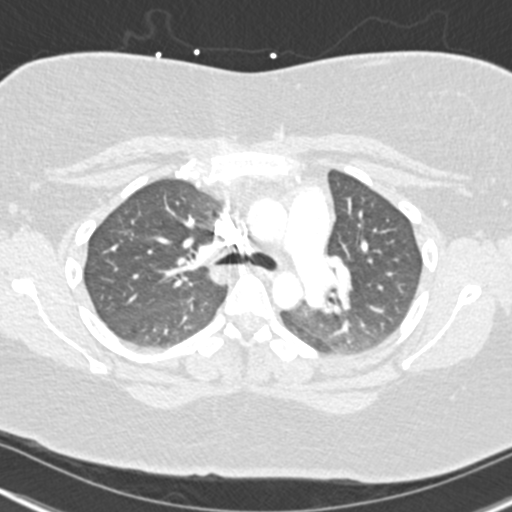
[im 183/244  mediastinal]
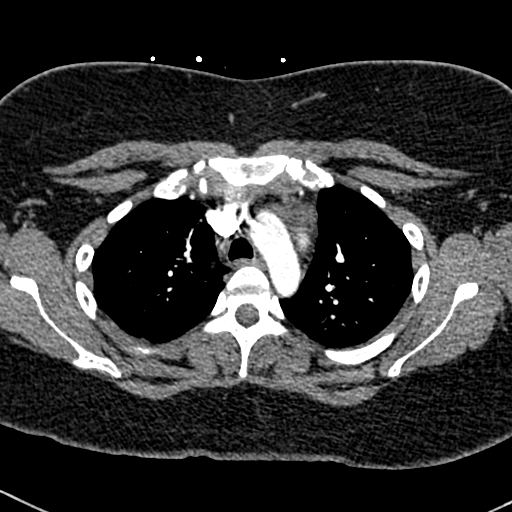
[im 195/244  lung]
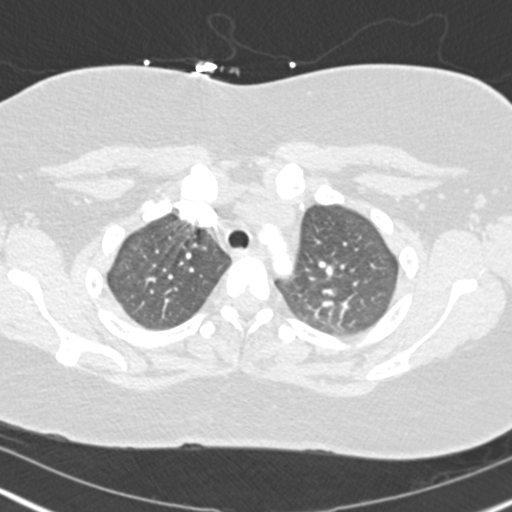
[im 207/244  mediastinal]
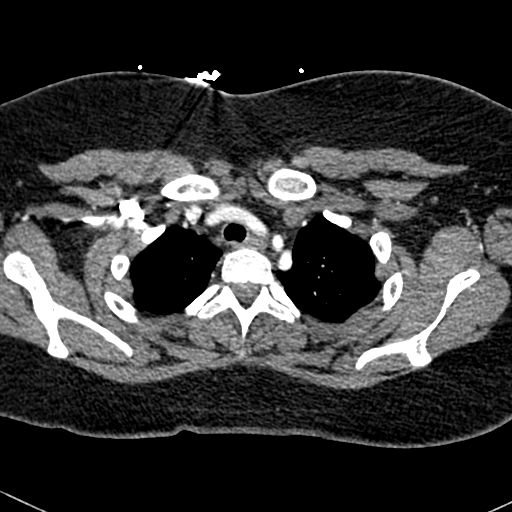
[im 219/244  lung]
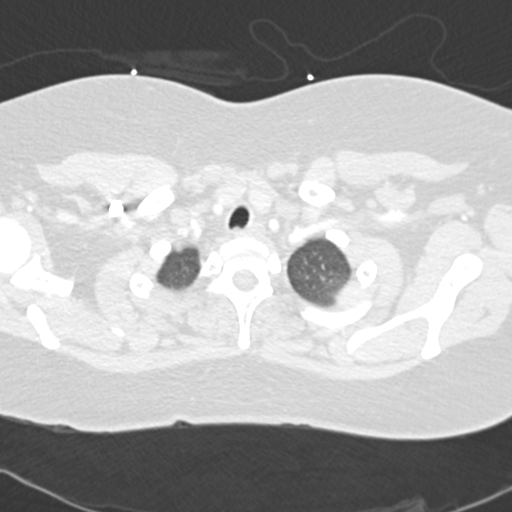
[im 231/244  mediastinal]
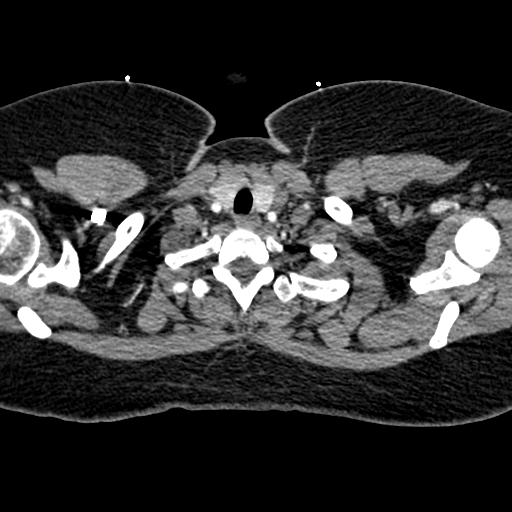

[Series 7: coronal mpr · coronal · 0.53mm/px · 1 of 66 slices shown]
[im 33/66  mediastinal]
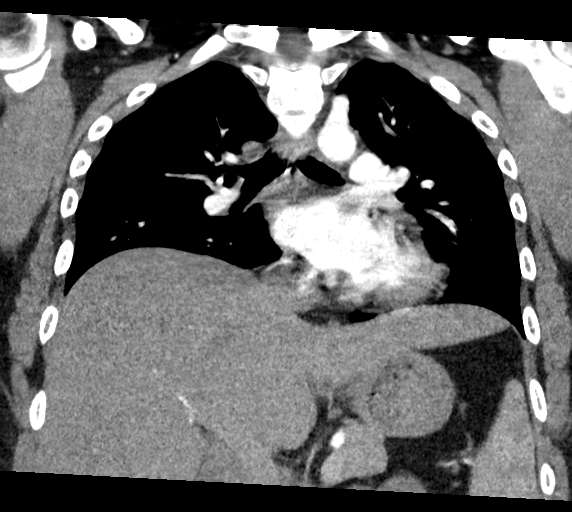

[19 of 36 positions shown; findings below may reference images not displayed]

FINDINGS: Cardiovascular: Motion artifact limits examination. There is good
opacification of the central and segmental pulmonary arteries. No
focal filling defects. No evidence of significant pulmonary embolus.
Normal caliber thoracic aorta. No aortic dissection. Normal heart
size. No pericardial effusions.

Mediastinum/Nodes: No significant mediastinal lymphadenopathy.
Esophagus is decompressed.

Lungs/Pleura: Hazy ground-glass opacities demonstrated in both lung
bases with somewhat mosaic attenuation pattern. This may represent
edema, air trapping, or airways disease. No pleural effusions. No
pneumothorax. Airways are patent.

Upper Abdomen: No acute abnormalities identified.

Musculoskeletal: No chest wall abnormality. No acute or significant
osseous findings.

Review of the MIP images confirms the above findings.
IMPRESSION: 1. No evidence of significant pulmonary embolus.
2. Hazy ground-glass opacities in both lung bases may represent
edema, air trapping, or airways disease.

## 2020-03-13 DIAGNOSIS — S8261XA Displaced fracture of lateral malleolus of right fibula, initial encounter for closed fracture: Secondary | ICD-10-CM | POA: Diagnosis not present

## 2020-03-13 DIAGNOSIS — S93401A Sprain of unspecified ligament of right ankle, initial encounter: Secondary | ICD-10-CM | POA: Diagnosis not present

## 2020-03-14 ENCOUNTER — Telehealth: Payer: Self-pay

## 2020-03-14 NOTE — Telephone Encounter (Signed)
Contacted Angel Cole.  She is at work today.  States she will fill out an injury report.  Supervisor to send injury report as well.

## 2020-03-14 NOTE — Telephone Encounter (Signed)
Angel Cole fell yesterday at work, hurting her right ankle.  Came to the clinic, but unable to wait for provider because she had to pick up her daughter at school & didn't have anyone else to pick her up.  Told her she could come back after she picked up her daughter.  Angel Cole didn't come back to the clinic on 03/13/20.  Called her this morning to see how she's doing.  States she went to [facility] after picking up her daughter.  She said they x-rayed her right ankle & there's no fracture - just a sprain.  Received a Rx for Naproxen.  Given a work note - off 03/14/20 & 03/15/20. She has a follow-up appt scheduled at [facility] on 03/23/20. States she's feeling some better than yesterday afternoon.  AMD

## 2020-04-29 DIAGNOSIS — M79661 Pain in right lower leg: Secondary | ICD-10-CM | POA: Diagnosis not present

## 2020-04-29 DIAGNOSIS — S99912A Unspecified injury of left ankle, initial encounter: Secondary | ICD-10-CM | POA: Diagnosis not present

## 2020-04-29 DIAGNOSIS — M7989 Other specified soft tissue disorders: Secondary | ICD-10-CM | POA: Diagnosis not present

## 2020-04-29 DIAGNOSIS — M25572 Pain in left ankle and joints of left foot: Secondary | ICD-10-CM | POA: Diagnosis not present

## 2020-04-29 DIAGNOSIS — M25571 Pain in right ankle and joints of right foot: Secondary | ICD-10-CM | POA: Diagnosis not present

## 2020-04-29 DIAGNOSIS — S99911A Unspecified injury of right ankle, initial encounter: Secondary | ICD-10-CM | POA: Diagnosis not present

## 2020-05-12 DIAGNOSIS — E669 Obesity, unspecified: Secondary | ICD-10-CM | POA: Diagnosis not present

## 2020-05-12 DIAGNOSIS — I10 Essential (primary) hypertension: Secondary | ICD-10-CM | POA: Diagnosis not present

## 2020-05-12 DIAGNOSIS — Z1331 Encounter for screening for depression: Secondary | ICD-10-CM | POA: Diagnosis not present

## 2020-05-12 DIAGNOSIS — E559 Vitamin D deficiency, unspecified: Secondary | ICD-10-CM | POA: Diagnosis not present

## 2020-05-12 DIAGNOSIS — D510 Vitamin B12 deficiency anemia due to intrinsic factor deficiency: Secondary | ICD-10-CM | POA: Diagnosis not present

## 2020-05-12 DIAGNOSIS — E782 Mixed hyperlipidemia: Secondary | ICD-10-CM | POA: Diagnosis not present

## 2020-05-12 DIAGNOSIS — E039 Hypothyroidism, unspecified: Secondary | ICD-10-CM | POA: Diagnosis not present

## 2020-05-12 DIAGNOSIS — R799 Abnormal finding of blood chemistry, unspecified: Secondary | ICD-10-CM | POA: Diagnosis not present

## 2020-05-26 DIAGNOSIS — D510 Vitamin B12 deficiency anemia due to intrinsic factor deficiency: Secondary | ICD-10-CM | POA: Diagnosis not present

## 2020-05-26 DIAGNOSIS — R7303 Prediabetes: Secondary | ICD-10-CM | POA: Diagnosis not present

## 2020-05-26 DIAGNOSIS — E669 Obesity, unspecified: Secondary | ICD-10-CM | POA: Diagnosis not present

## 2020-05-26 DIAGNOSIS — E559 Vitamin D deficiency, unspecified: Secondary | ICD-10-CM | POA: Diagnosis not present

## 2020-05-26 DIAGNOSIS — D509 Iron deficiency anemia, unspecified: Secondary | ICD-10-CM | POA: Diagnosis not present

## 2020-06-08 DIAGNOSIS — D510 Vitamin B12 deficiency anemia due to intrinsic factor deficiency: Secondary | ICD-10-CM | POA: Diagnosis not present

## 2020-09-28 ENCOUNTER — Encounter: Payer: Self-pay | Admitting: Emergency Medicine

## 2020-09-28 ENCOUNTER — Emergency Department
Admission: EM | Admit: 2020-09-28 | Discharge: 2020-09-28 | Disposition: A | Discharge status: Home / Self Care | Payer: 59 | Attending: Emergency Medicine | Admitting: Emergency Medicine

## 2020-09-28 ENCOUNTER — Other Ambulatory Visit: Payer: Self-pay

## 2020-09-28 DIAGNOSIS — R1013 Epigastric pain: Secondary | ICD-10-CM | POA: Insufficient documentation

## 2020-09-28 LAB — CBC
HCT: 35.6 % — ABNORMAL LOW (ref 36.0–46.0)
Hemoglobin: 11.2 g/dL — ABNORMAL LOW (ref 12.0–15.0)
MCH: 24 pg — ABNORMAL LOW (ref 26.0–34.0)
MCHC: 31.5 g/dL (ref 30.0–36.0)
MCV: 76.2 fL — ABNORMAL LOW (ref 80.0–100.0)
Platelets: 269 10*3/uL (ref 150–400)
RBC: 4.67 MIL/uL (ref 3.87–5.11)
RDW: 15.7 % — ABNORMAL HIGH (ref 11.5–15.5)
WBC: 10.2 10*3/uL (ref 4.0–10.5)
nRBC: 0 % (ref 0.0–0.2)

## 2020-09-28 LAB — URINALYSIS, COMPLETE (UACMP) WITH MICROSCOPIC
Bacteria, UA: NONE SEEN
Bilirubin Urine: NEGATIVE
Glucose, UA: NEGATIVE mg/dL
Hgb urine dipstick: NEGATIVE
Ketones, ur: NEGATIVE mg/dL
Nitrite: NEGATIVE
Protein, ur: NEGATIVE mg/dL
Specific Gravity, Urine: 1.019 (ref 1.005–1.030)
pH: 7 (ref 5.0–8.0)

## 2020-09-28 LAB — LIPASE, BLOOD: Lipase: 29 U/L (ref 11–51)

## 2020-09-28 LAB — COMPREHENSIVE METABOLIC PANEL
ALT: 20 U/L (ref 0–44)
AST: 18 U/L (ref 15–41)
Albumin: 3.7 g/dL (ref 3.5–5.0)
Alkaline Phosphatase: 130 U/L — ABNORMAL HIGH (ref 38–126)
Anion gap: 10 (ref 5–15)
BUN: 11 mg/dL (ref 6–20)
CO2: 25 mmol/L (ref 22–32)
Calcium: 8.6 mg/dL — ABNORMAL LOW (ref 8.9–10.3)
Chloride: 101 mmol/L (ref 98–111)
Creatinine, Ser: 0.78 mg/dL (ref 0.44–1.00)
GFR calc non Af Amer: >60 mL/min (ref >60)
Glucose, Bld: 99 mg/dL (ref 70–99)
Potassium: 3.8 mmol/L (ref 3.5–5.1)
Sodium: 136 mmol/L (ref 135–145)
Total Bilirubin: 0.5 mg/dL (ref 0.3–1.2)
Total Protein: 8.5 g/dL — ABNORMAL HIGH (ref 6.5–8.1)

## 2020-09-28 LAB — POCT PREGNANCY, URINE: Preg Test, Ur: NEGATIVE

## 2020-09-28 MED ORDER — FAMOTIDINE 20 MG PO TABS: 20.0000 mg | ORAL_TABLET | Freq: Two times a day (BID) | ORAL | 1 refills | Status: DC

## 2020-09-28 MED ORDER — LACTATED RINGERS IV BOLUS
1000.0000 mL | Freq: Once | INTRAVENOUS | Status: AC
  Administered 2020-09-28: 1000 mL via INTRAVENOUS

## 2020-09-28 MED ORDER — LIDOCAINE VISCOUS HCL 2 % MT SOLN
15.0000 mL | Freq: Once | OROMUCOSAL | Status: AC
  Administered 2020-09-28: 15 mL via ORAL
  Filled 2020-09-28: qty 15

## 2020-09-28 MED ORDER — ALUM & MAG HYDROXIDE-SIMETH 200-200-20 MG/5ML PO SUSP
30.0000 mL | Freq: Once | ORAL | Status: AC
  Administered 2020-09-28: 30 mL via ORAL
  Filled 2020-09-28: qty 30

## 2020-09-28 MED ORDER — SUCRALFATE 1 GM/10ML PO SUSP: 1.0000 g | Freq: Four times a day (QID) | ORAL | 1 refills | Status: DC

## 2020-09-28 MED ORDER — FAMOTIDINE IN NACL 20-0.9 MG/50ML-% IV SOLN
20.0000 mg | Freq: Once | INTRAVENOUS | Status: AC
  Administered 2020-09-28: 20 mg via INTRAVENOUS
  Filled 2020-09-28: qty 50

## 2020-09-28 NOTE — ED Provider Notes (Signed)
[facility] Emergency Department Provider Note   ____________________________________________   First MD Initiated Contact with Patient 09/28/20 2118     (approximate)  I have reviewed the triage vital signs and the nursing notes.   HISTORY  Chief Complaint Abdominal Pain (Pt comes into the ED via POV sent by [facility] clinic for upper abd pain that started 2 days ago.  Pt states the pain is a constant epigastric pressure and it gives her a feeling of being "full" all the time.  Denies any N/V/D. )    HPI Angel Cole is a 29 y.o. female with no stated past medical history the presents for midepigastric abdominal pain that been worsening over the last 2 days.  Patient describes burning pain to the midepigastric region that does not radiate and is 6/10 in severity.  Patient states she has never had similar symptoms in the past.  Patient states that this pain is worsened on deep inspiration and feels that she cannot get it deep breath without causing more pain.  Patient denies any other exacerbating factors.  Patient denies any relieving factors.  Patient states she does have family history of gastric reflux         Past Medical History:  Diagnosis Date  . Obesity     Patient Active Problem List   Diagnosis Date Noted  . OTITIS MEDIA, LEFT 07/12/2009    Past Surgical History:  Procedure Laterality Date  . THERAPEUTIC ABORTION    . TONSILLECTOMY AND ADENOIDECTOMY     age 39    Prior to Admission medications   Medication Sig Start Date End Date Taking? Authorizing Provider  famotidine (PEPCID) 20 MG tablet Take 1 tablet (20 mg total) by mouth 2 (two) times daily. 09/28/20 09/28/21  Merwyn Katos, MD  sucralfate (CARAFATE) 1 GM/10ML suspension Take 10 mLs (1 g total) by mouth 4 (four) times daily. 09/28/20 09/28/21  Merwyn Katos, MD    Allergies Patient has no known allergies.  Family History  Problem Relation Age of Onset  . Hypertension Maternal  Grandmother   . Diabetes Maternal Grandfather   . Hypertension Maternal Grandfather   . Hyperlipidemia Maternal Grandfather     Social History Social History   Tobacco Use  . Smoking status: Never Smoker  . Smokeless tobacco: Never Used  Vaping Use  . Vaping Use: Never used  Substance Use Topics  . Alcohol use: No  . Drug use: No    Review of Systems Constitutional: No fever/chills Eyes: No visual changes. ENT: No sore throat. Cardiovascular: Denies chest pain. Respiratory: Denies shortness of breath. Gastrointestinal: Endorses abdominal pain.  No nausea, no vomiting.  No diarrhea. Genitourinary: Negative for dysuria. Musculoskeletal: Negative for acute arthralgias Skin: Negative for rash. Neurological: Negative for headaches, weakness/numbness/paresthesias in any extremity Psychiatric: Negative for suicidal ideation/homicidal ideation   ____________________________________________   PHYSICAL EXAM:  VITAL SIGNS: ED Triage Vitals  Enc Vitals Group     BP 09/28/20 1824 (!) 142/96     Pulse Rate 09/28/20 1824 (!) 111     Resp 09/28/20 1824 (!) 24     Temp 09/28/20 1824 99.3 F (37.4 C)     Temp Source 09/28/20 1824 Oral     SpO2 09/28/20 1824 100 %     Weight 09/28/20 1818 (!) 310 lb (140.6 kg)     Height 09/28/20 1818 5\' 5"  (1.651 m)     Head Circumference --      Peak Flow --  Pain Score 09/28/20 1818 6     Pain Loc --      Pain Edu? --      Excl. in GC? --    Constitutional: Alert and oriented. Well appearing and in no acute distress.  Obese Eyes: Conjunctivae are normal. PERRL. Head: Atraumatic. Nose: No congestion/rhinnorhea. Mouth/Throat: Mucous membranes are moist. Neck: No stridor Cardiovascular: Grossly normal heart sounds.  Good peripheral circulation. Respiratory: Normal respiratory effort.  No retractions. Gastrointestinal: Soft and nontender. No distention. Musculoskeletal: No obvious deformities Neurologic:  Normal speech and language.  No gross focal neurologic deficits are appreciated. Skin:  Skin is warm and dry. No rash noted. Psychiatric: Mood and affect are normal. Speech and behavior are normal.  ____________________________________________   LABS (all labs ordered are listed, but only abnormal results are displayed)  Labs Reviewed  COMPREHENSIVE METABOLIC PANEL - Abnormal; Notable for the following components:      Result Value   Calcium 8.6 (*)    Total Protein 8.5 (*)    Alkaline Phosphatase 130 (*)    All other components within normal limits  CBC - Abnormal; Notable for the following components:   Hemoglobin 11.2 (*)    HCT 35.6 (*)    MCV 76.2 (*)    MCH 24.0 (*)    RDW 15.7 (*)    All other components within normal limits  URINALYSIS, COMPLETE (UACMP) WITH MICROSCOPIC - Abnormal; Notable for the following components:   Color, Urine YELLOW (*)    APPearance HAZY (*)    Leukocytes,Ua MODERATE (*)    All other components within normal limits  LIPASE, BLOOD  POC URINE PREG, ED  POCT PREGNANCY, URINE   ____________________________________________  EKG  ED ECG REPORT I, Merwyn Katos, the attending physician, personally viewed and interpreted this ECG.  Date: 09/28/2020 EKG Time: 1816 Rate: 108 Rhythm: Tachycardic sinus rhythm QRS Axis: normal Intervals: normal ST/T Wave abnormalities: normal Narrative Interpretation: no evidence of acute ischemia  ____________________________________________  PROCEDURES  Procedure(s) performed (including Critical Care):  Procedures   ____________________________________________   INITIAL IMPRESSION / ASSESSMENT AND PLAN / ED COURSE  As part of my medical decision making, I reviewed the following data within the electronic MEDICAL RECORD NUMBER Nursing notes reviewed and incorporated, Labs reviewed, EKG interpreted, Old chart reviewed, and Notes from prior ED visits reviewed and incorporated        Patients symptoms not typical for emergent  causes of abdominal pain such as, but not limited to, appendicitis, abdominal aortic aneurysm, surgical biliary disease, pancreatitis, SBO, mesenteric ischemia, serious intra-abdominal bacterial illness. Presentation also not typical of gynecologic emergencies such as TOA, Ovarian Torsion, PID. Not Ectopic. Doubt atypical ACS.  Pt tolerating PO. Disposition: Patient will be discharged with strict return precautions and follow up with primary MD within 12-24 hours for further evaluation. Patient understands that this still may have an early presentation of an emergent medical condition such as appendicitis that will require a recheck.      ____________________________________________   FINAL CLINICAL IMPRESSION(S) / ED DIAGNOSES  Final diagnoses:  Midepigastric pain     ED Discharge Orders         Ordered    famotidine (PEPCID) 20 MG tablet  2 times daily        09/28/20 2314    sucralfate (CARAFATE) 1 GM/10ML suspension  4 times daily        09/28/20 2314           Note:  This  document was prepared using Conservation officer, historic buildings and may include unintentional dictation errors.   Merwyn Katos, MD 09/28/20 [phone]

## 2020-09-28 NOTE — ED Triage Notes (Signed)
First nurse note- here for abd pain from [facility] clinic. NAD. ambulatory

## 2021-05-31 ENCOUNTER — Encounter: Payer: Self-pay | Admitting: Urology

## 2021-05-31 ENCOUNTER — Other Ambulatory Visit: Payer: Self-pay

## 2021-05-31 ENCOUNTER — Ambulatory Visit (INDEPENDENT_AMBULATORY_CARE_PROVIDER_SITE_OTHER): Payer: No Typology Code available for payment source | Admitting: Urology

## 2021-05-31 VITALS — BP 123/75 | HR 120 | Ht 65.0 in | Wt 325.0 lb

## 2021-05-31 DIAGNOSIS — R310 Gross hematuria: Secondary | ICD-10-CM

## 2021-05-31 DIAGNOSIS — R1031 Right lower quadrant pain: Secondary | ICD-10-CM

## 2021-05-31 DIAGNOSIS — R109 Unspecified abdominal pain: Secondary | ICD-10-CM | POA: Diagnosis not present

## 2021-05-31 DIAGNOSIS — R10A1 Flank pain, right side: Secondary | ICD-10-CM

## 2021-05-31 LAB — MICROSCOPIC EXAMINATION: Bacteria, UA: NONE SEEN

## 2021-05-31 LAB — URINALYSIS, COMPLETE
Bilirubin, UA: NEGATIVE
Glucose, UA: NEGATIVE
Ketones, UA: NEGATIVE
Nitrite, UA: NEGATIVE
Protein,UA: NEGATIVE
Specific Gravity, UA: 1.025 (ref 1.005–1.030)
Urobilinogen, Ur: 0.2 mg/dL (ref 0.2–1.0)
pH, UA: 5.5 (ref 5.0–7.5)

## 2021-05-31 NOTE — Progress Notes (Signed)
05/31/2021 3:19 PM   Renell Cole September 13, 1991 [id number]  Referring provider: Simonne Martinet, MD [address] [place],  [place] [address]  Chief Complaint  Patient presents with   Hematuria    HPI: Angel Cole is a 30 y.o. female referred for evaluation of flank pain and hematuria.  Seen [facility] 05/25/2021 with a 3-week history of right flank and right lower quadrant abdominal pain associated with urinary frequency and a 1 week history of gross hematuria and intermittent nausea. Urinalysis showed 10-50 RBCs and 0 WBCs.  There were moderate squamous epithelial cells present. Urine culture grew mixed flora States pain initially began in the right lower quadrant several weeks ago and has progressed to the right flank.  Pain described as a constant dull pain and intermittently sharp and stabbing severity rated 2-3/10 and at its worst rated 5/10 Denies dysuria Does note some urinary frequency and urgency No prior history stone disease A stone protocol CT January 2020 showed no urinary calculi Denies prior history urologic problems or prior urologic evaluation She was prescribed cefdinir, tamsulosin and ketorolac at [facility]   PMH: Past Medical History:  Diagnosis Date   Obesity     Surgical History: Past Surgical History:  Procedure Laterality Date   THERAPEUTIC ABORTION     TONSILLECTOMY AND ADENOIDECTOMY     age 47    Home Medications:  Allergies as of 05/31/2021   No Known Allergies      Medication List        Accurate as of May 31, 2021  3:19 PM. If you have any questions, ask your nurse or doctor.          cefdinir 300 MG capsule Commonly known as: OMNICEF Take by mouth.   famotidine 20 MG tablet Commonly known as: PEPCID Take 1 tablet (20 mg total) by mouth 2 (two) times daily.   ketorolac 10 MG tablet Commonly known as: TORADOL Take 10 mg by mouth 3 (three) times daily as needed.   sucralfate 1 GM/10ML suspension Commonly  known as: Carafate Take 10 mLs (1 g total) by mouth 4 (four) times daily.   tamsulosin 0.4 MG Caps capsule Commonly known as: FLOMAX Take by mouth.        Allergies: No Known Allergies  Family History: Family History  Problem Relation Age of Onset   Hypertension Maternal Grandmother    Diabetes Maternal Grandfather    Hypertension Maternal Grandfather    Hyperlipidemia Maternal Grandfather     Social History:  reports that she has never smoked. She has never used smokeless tobacco. She reports that she does not drink alcohol and does not use drugs.   Physical Exam: BP 123/75   Pulse (!) 120   Ht 5\' 5"  (1.651 m)   Wt (!) 325 lb (147.4 kg)   BMI 54.08 kg/m   Constitutional:  Alert and oriented, No acute distress. HEENT: [place] AT, moist mucus membranes.  Trachea midline, no masses. Cardiovascular: No clubbing, cyanosis, or edema. Respiratory: Normal respiratory effort, no increased work of breathing. Neurologic: Grossly intact, no focal deficits, moving all 4 extremities. Psychiatric: Normal mood and affect.  Laboratory Data:  Urinalysis Microscopy 6-10 WBC/3-10 RBC   Pertinent Imaging: Microscopy 3-10 RBC/6-10 WBC   Assessment & Plan:    1. Gross hematuria Urine today grossly clear with pyuria and microhematuria Urine culture repeated Schedule CTU for further evaluation Cystoscopy if no significant findings on CT  2.  Right flank/abdominal pain CT as above  Abbie Sons, [place] [address] [place], [place] [address] [phone]

## 2021-06-03 LAB — CULTURE, URINE COMPREHENSIVE

## 2021-06-22 ENCOUNTER — Ambulatory Visit
Admission: RE | Admit: 2021-06-22 | Discharge: 2021-06-22 | Disposition: A | Discharge status: Home / Self Care | Payer: No Typology Code available for payment source | Source: Ambulatory Visit | Attending: Urology | Admitting: Urology

## 2021-06-22 ENCOUNTER — Other Ambulatory Visit: Payer: Self-pay

## 2021-06-22 DIAGNOSIS — R1031 Right lower quadrant pain: Secondary | ICD-10-CM | POA: Insufficient documentation

## 2021-06-22 DIAGNOSIS — R310 Gross hematuria: Secondary | ICD-10-CM | POA: Insufficient documentation

## 2021-06-22 DIAGNOSIS — R109 Unspecified abdominal pain: Secondary | ICD-10-CM

## 2021-06-22 MED ORDER — IOHEXOL 350 MG/ML SOLN
150.0000 mL | Freq: Once | INTRAVENOUS | Status: AC | PRN
  Administered 2021-06-22: 15:00:00 150 mL via INTRAVENOUS

## 2021-06-26 ENCOUNTER — Telehealth: Payer: Self-pay | Admitting: *Deleted

## 2021-06-26 ENCOUNTER — Encounter: Payer: Self-pay | Admitting: *Deleted

## 2021-06-26 NOTE — Telephone Encounter (Signed)
-----   Message from Riki Altes, MD sent at 06/25/2021 10:15 AM EDT ----- CT urogram showed no urologic abnormalities.  Recommend scheduling cystoscopy for bladder evaluation since CT was negative.

## 2021-06-26 NOTE — Telephone Encounter (Signed)
Left message and sent my chart message  

## 2021-08-05 ENCOUNTER — Telehealth: Payer: Self-pay | Admitting: Urology

## 2021-08-05 NOTE — Telephone Encounter (Signed)
Cystoscopy was recommended late June 2022.  You left voicemail and sent my chart message however patient did not respond and has not scheduled.  Please try 1 more time and if she does not respond send back to me and will send a registered letter

## 2021-08-07 NOTE — Telephone Encounter (Signed)
Pt called office and scheduled cysto

## 2021-08-07 NOTE — Telephone Encounter (Signed)
Left message on voicemail.

## 2021-08-22 ENCOUNTER — Other Ambulatory Visit: Payer: No Typology Code available for payment source | Admitting: Urology

## 2021-08-24 ENCOUNTER — Encounter: Payer: Self-pay | Admitting: Urology

## 2022-10-20 ENCOUNTER — Other Ambulatory Visit
Admission: RE | Admit: 2022-10-20 | Discharge: 2022-10-20 | Disposition: A | Discharge status: Home / Self Care | Payer: No Typology Code available for payment source | Source: Ambulatory Visit | Attending: Family Medicine | Admitting: Family Medicine

## 2022-10-20 DIAGNOSIS — R3589 Other polyuria: Secondary | ICD-10-CM | POA: Diagnosis present

## 2022-10-20 DIAGNOSIS — R5383 Other fatigue: Secondary | ICD-10-CM | POA: Diagnosis present

## 2022-10-20 DIAGNOSIS — R632 Polyphagia: Secondary | ICD-10-CM | POA: Diagnosis present

## 2022-10-20 LAB — BASIC METABOLIC PANEL
Anion gap: 9 (ref 5–15)
BUN: 11 mg/dL (ref 6–20)
CO2: 23 mmol/L (ref 22–32)
Calcium: 8.9 mg/dL (ref 8.9–10.3)
Chloride: 105 mmol/L (ref 98–111)
Creatinine, Ser: 0.84 mg/dL (ref 0.44–1.00)
GFR, Estimated: >60 mL/min (ref >60)
Glucose, Bld: 86 mg/dL (ref 70–99)
Potassium: 3.9 mmol/L (ref 3.5–5.1)
Sodium: 137 mmol/L (ref 135–145)

## 2022-12-11 IMAGING — CT CT ABD-PEL WO/W CM
3 of 8 series · 15 of 46 positions shown, 17 images · IV contrast (omnipaque)
Comparison: CT January 19, 2019

CLINICAL DATA: Intermittent right flank pain with gross hematuria
x3 months

EXAM:
CT ABDOMEN AND PELVIS WITHOUT AND WITH CONTRAST
TECHNIQUE: Multidetector CT imaging of the abdomen and pelvis was performed
following the standard protocol before and following the bolus
administration of intravenous contrast.
CONTRAST:  150mL OMNIPAQUE IOHEXOL 350 MG/ML SOLN

[Series 2: axial without without pre 5.00 · axial · non-contrast · 0.72mm/px · z∈[-1563,-1148]mm · 10 of 103 slices shown, 12 images]
[im 10/103  soft-tissue]
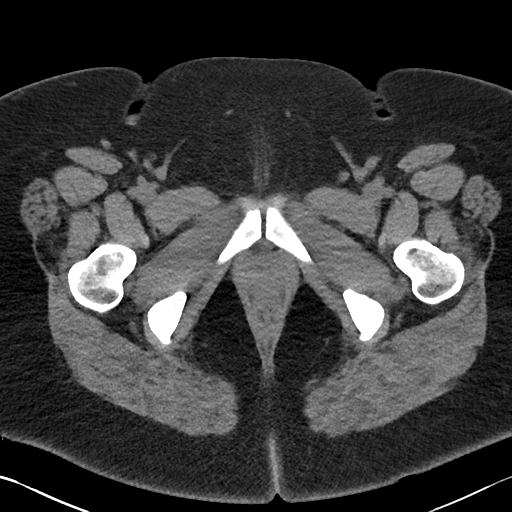
[im 10/103  bone]
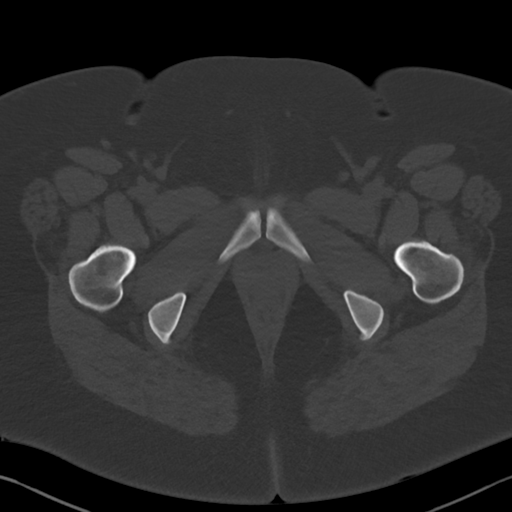
[im 19/103  soft-tissue]
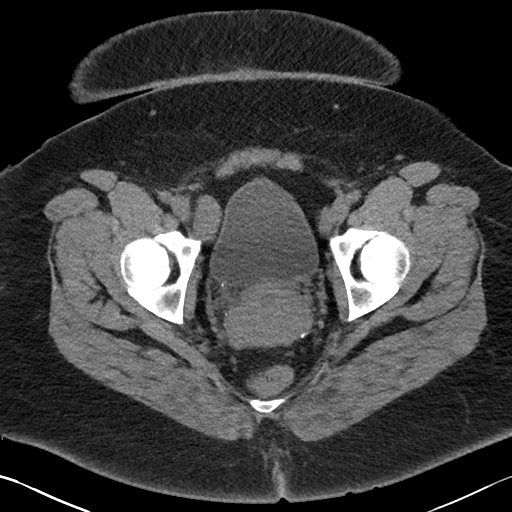
[im 28/103  soft-tissue]
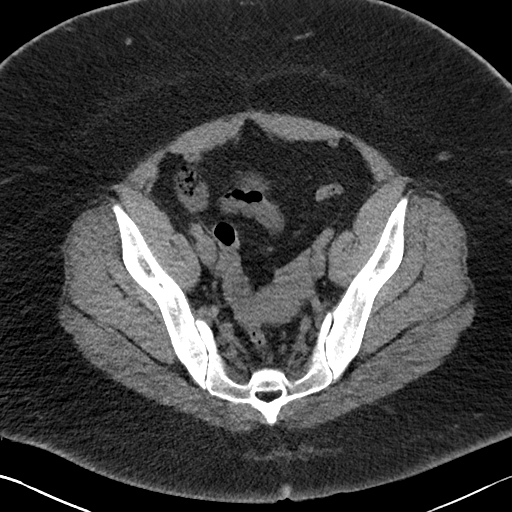
[im 38/103  soft-tissue]
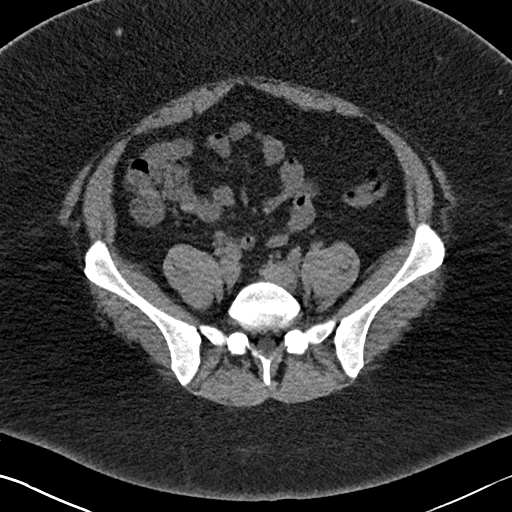
[im 47/103  soft-tissue]
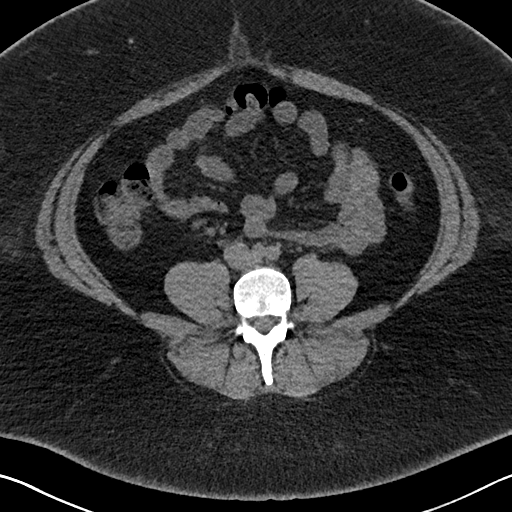
[im 56/103  soft-tissue]
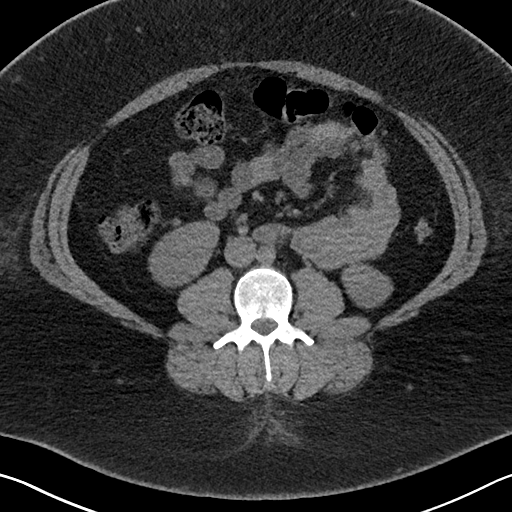
[im 65/103  soft-tissue]
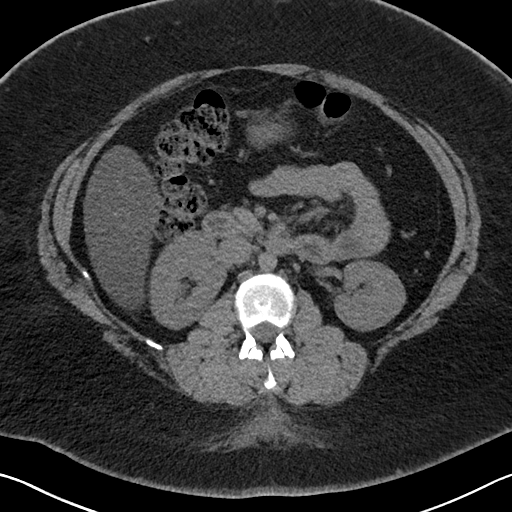
[im 75/103  soft-tissue]
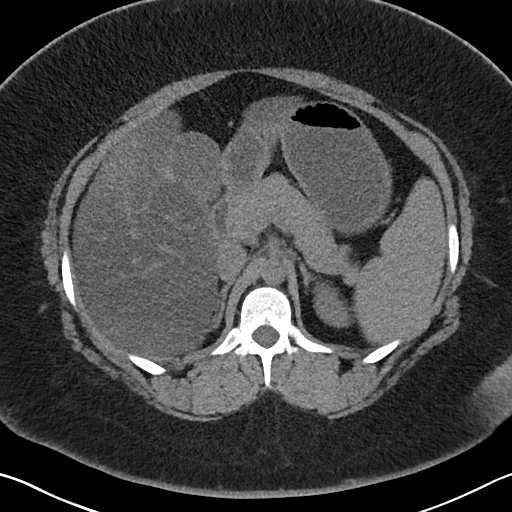
[im 84/103  soft-tissue]
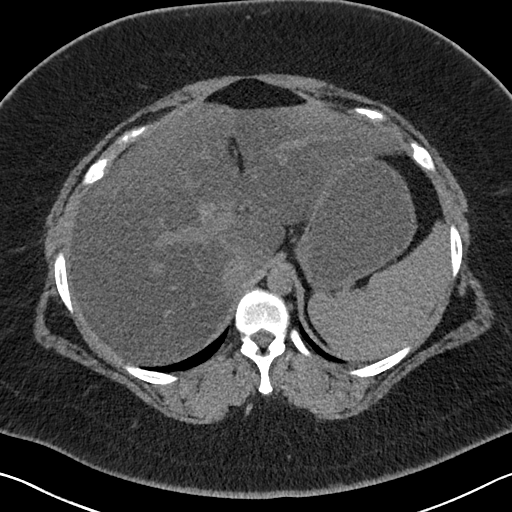
[im 84/103  bone]
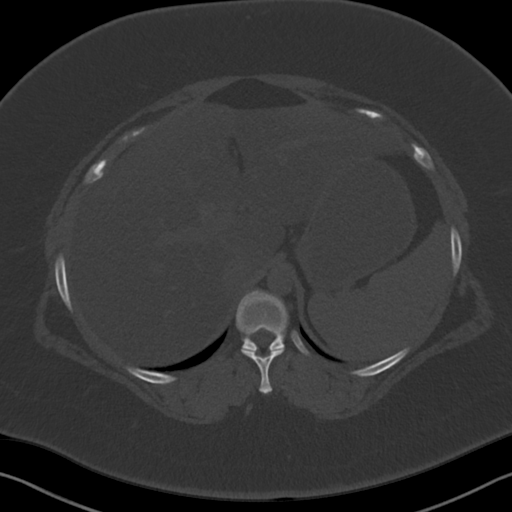
[im 93/103  soft-tissue]
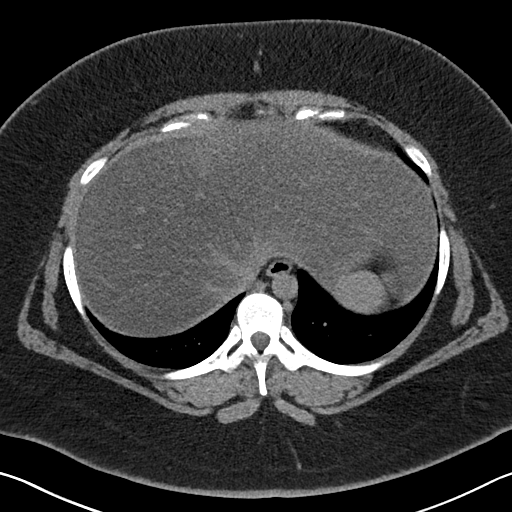

[Series 4: lung without without pre 5.00 · axial · non-contrast · 0.72mm/px · z∈[-1198,-1148]mm · 2 of 30 slices shown]
[im 10/30  bone]
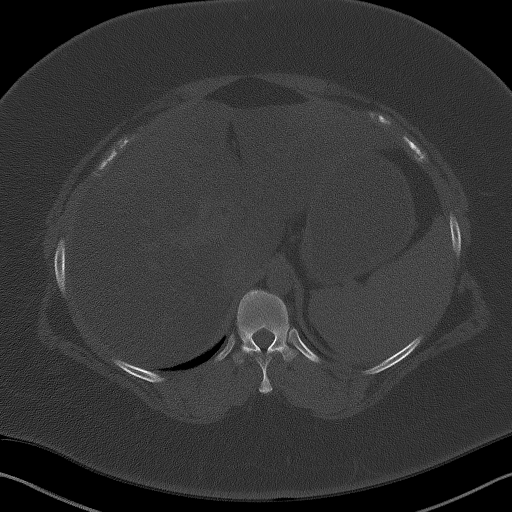
[im 20/30  bone]
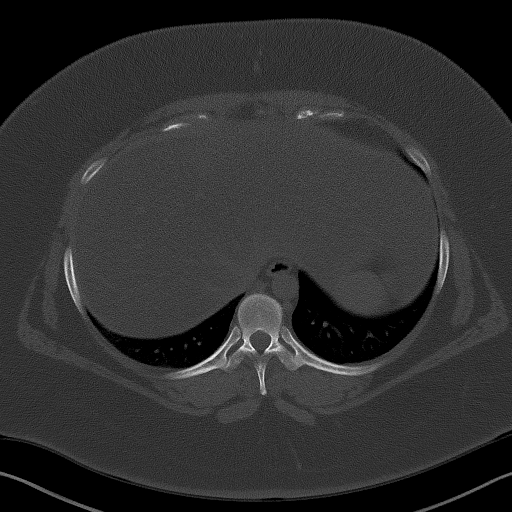

[Series 5: cor without without pre 2.00 cor · coronal · non-contrast · 0.72mm/px · 3 of 168 slices shown]
[im 42/168  soft-tissue]
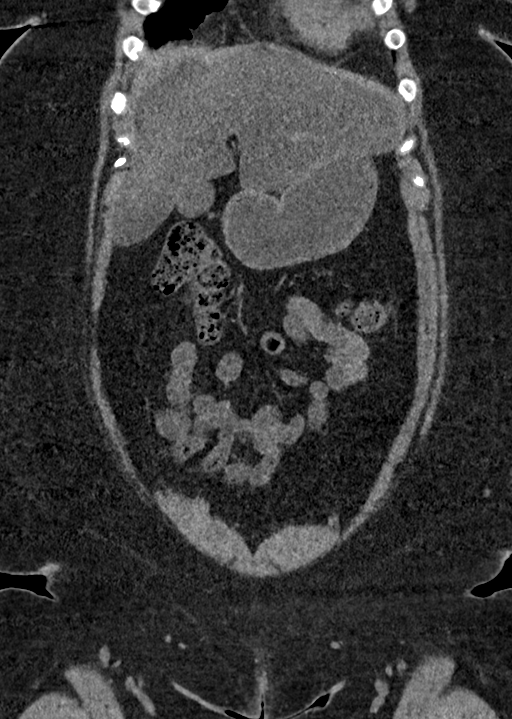
[im 84/168  soft-tissue]
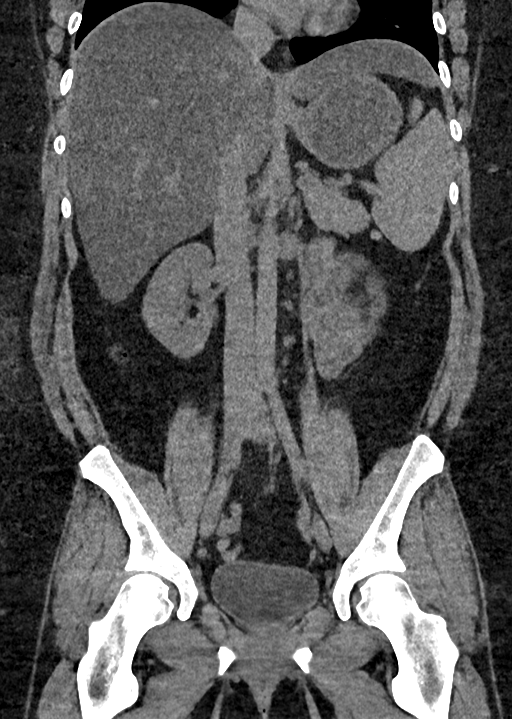
[im 126/168  soft-tissue]
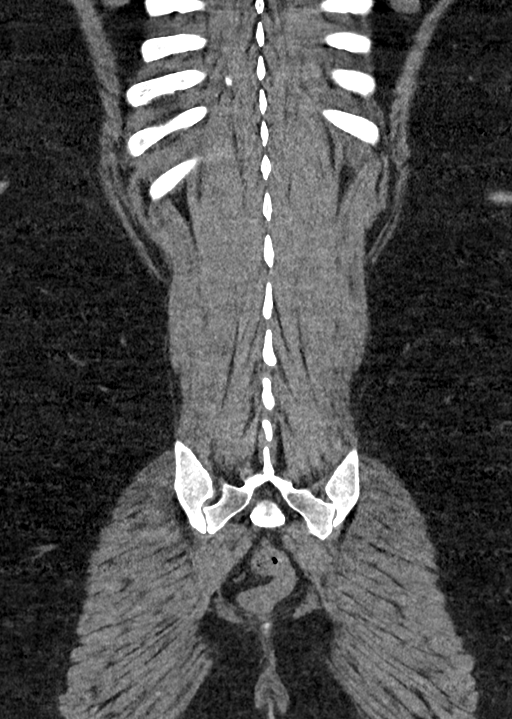

[15 of 46 positions shown; findings below may reference images not displayed]

FINDINGS: Lower chest: No acute abnormality.

Hepatobiliary: Hepatomegaly with severe diffuse hepatic steatosis.
Gallbladder is unremarkable. No biliary ductal dilation.

Pancreas: Within normal limits.

Spleen: Within normal limits.

Adrenals/Urinary Tract: Bilateral adrenal glands are unremarkable.

No hydronephrosis. No renal, ureteral or bladder calculi visualized.
No solid enhancing renal masses. There is symmetric enhancement and
excretion of contrast from bilateral kidneys. No suspicious filling
defect visualized within the opacified portions of the collecting
system or ureters on delayed imaging.

Urinary bladder is grossly unremarkable.

Stomach/Bowel: Stomach is grossly unremarkable. No pathologic
dilation of small bowel. The appendix and terminal ileum within
normal limits. The left hemicolon is predominantly decompressed
limiting evaluation.

Vascular/Lymphatic: No significant vascular findings are present. No
pathologically enlarged abdominal lymph nodes. Prominent/Mildly
enlarged inguinal lymph nodes for instance a right inguinal lymph
node now measures 1.5 cm in short axis previously 2 cm and a
prominent left inguinal lymph node now measures 1.2 cm in short axis
previously 0.8 cm.

Reproductive: Uterus and bilateral adnexa are unremarkable.

Other: No abdominopelvic ascites.

Musculoskeletal: No acute or significant osseous findings.
IMPRESSION: 1. No hydronephrosis. No renal, ureteral, or bladder calculi. No
solid enhancing renal masses.
2. Hepatomegaly with severe diffuse hepatic steatosis.
3. Mildly enlarged right inguinal lymph node and a borderline
enlarged left inguinal lymph node, with decreased size of right
inguinal and increased size a left inguinal lymph nodes in
comparison to prior, nonspecific but given the relative long-term
stability these are favored to be a benign/indolent process and
possibly reactive.

## 2022-12-17 ENCOUNTER — Ambulatory Visit: Payer: No Typology Code available for payment source | Admitting: Nurse Practitioner

## 2023-02-03 ENCOUNTER — Ambulatory Visit
Admission: EM | Admit: 2023-02-03 | Discharge: 2023-02-03 | Disposition: A | Discharge status: Home / Self Care | Payer: No Typology Code available for payment source | Attending: Urgent Care | Admitting: Urgent Care

## 2023-02-03 DIAGNOSIS — R6889 Other general symptoms and signs: Secondary | ICD-10-CM | POA: Diagnosis not present

## 2023-02-03 MED ORDER — BENZONATATE 100 MG PO CAPS: ORAL_CAPSULE | ORAL | 0 refills | Status: DC

## 2023-02-03 MED ORDER — OSELTAMIVIR PHOSPHATE 75 MG PO CAPS: 75.0000 mg | ORAL_CAPSULE | Freq: Two times a day (BID) | ORAL | 0 refills | Status: DC

## 2023-02-03 NOTE — ED Provider Notes (Signed)
[facility]    CSN: [id number] Arrival date & time: 02/03/23  1733      History   Chief Complaint No chief complaint on file.   HPI Angel Cole is a 32 y.o. female.   HPI  Presents to urgent care with headache and cough starting yesterday.  She states her child tested positive for flu on Saturday.  She has been treating her symptoms with ibuprofen as well as cough medication.  Past Medical History:  Diagnosis Date   Obesity     Patient Active Problem List   Diagnosis Date Noted   OTITIS MEDIA, LEFT 07/12/2009    Past Surgical History:  Procedure Laterality Date   THERAPEUTIC ABORTION     TONSILLECTOMY AND ADENOIDECTOMY     age 20    OB History     Gravida  1   Para  1   Term  1   Preterm      AB      Living  1      SAB      IAB      Ectopic      Multiple      Live Births  1            Home Medications    Prior to Admission medications   Medication Sig Start Date End Date Taking? Authorizing Provider  famotidine (PEPCID) 20 MG tablet Take 1 tablet (20 mg total) by mouth 2 (two) times daily. 09/28/20 09/28/21  Naaman Plummer, MD  ketorolac (TORADOL) 10 MG tablet Take 10 mg by mouth 3 (three) times daily as needed. 05/25/21   [provider]  sucralfate (CARAFATE) 1 GM/10ML suspension Take 10 mLs (1 g total) by mouth 4 (four) times daily. 09/28/20 09/28/21  Naaman Plummer, MD    Family History Family History  Problem Relation Age of Onset   Hypertension Maternal Grandmother    Diabetes Maternal Grandfather    Hypertension Maternal Grandfather    Hyperlipidemia Maternal Grandfather     Social History Social History   Tobacco Use   Smoking status: Never   Smokeless tobacco: Never  Vaping Use   Vaping Use: Never used  Substance Use Topics   Alcohol use: No   Drug use: No     Allergies   Patient has no known allergies.   Review of Systems Review of Systems   Physical Exam Triage Vital Signs ED  Triage Vitals  Enc Vitals Group     BP      Pulse      Resp      Temp      Temp src      SpO2      Weight      Height      Head Circumference      Peak Flow      Pain Score      Pain Loc      Pain Edu?      Excl. in [place]?    No data found.  Updated Vital Signs There were no vitals taken for this visit.  Visual Acuity Right Eye Distance:   Left Eye Distance:   Bilateral Distance:    Right Eye Near:   Left Eye Near:    Bilateral Near:     Physical Exam Vitals reviewed.  Constitutional:      Appearance: Normal appearance. She is ill-appearing.  HENT:     Mouth/Throat:     Pharynx:  Posterior oropharyngeal erythema present. No oropharyngeal exudate.  Cardiovascular:     Rate and Rhythm: Normal rate and regular rhythm.     Pulses: Normal pulses.     Heart sounds: Normal heart sounds.  Pulmonary:     Effort: Pulmonary effort is normal.     Breath sounds: Normal breath sounds.  Skin:    General: Skin is warm and dry.  Neurological:     General: No focal deficit present.     Mental Status: She is alert and oriented to person, place, and time.  Psychiatric:        Mood and Affect: Mood normal.        Behavior: Behavior normal.      UC Treatments / Results  Labs (all labs ordered are listed, but only abnormal results are displayed) Labs Reviewed - No data to display  EKG   Radiology No results found.  Procedures Procedures (including critical care time)  Medications Ordered in UC Medications - No data to display  Initial Impression / Assessment and Plan / UC Course  I have reviewed the triage vital signs and the nursing notes.  Pertinent labs & imaging results that were available during my care of the patient were reviewed by me and considered in my medical decision making (see chart for details).   Patient is afebrile here without recent antipyretics. Satting well on room air. Overall is ill appearing, well hydrated, without respiratory distress.  Pulmonary exam is unremarkable.  Lungs CTAB without wheezing, rhonchi, rales.  Patient's symptoms are consistent with an acute viral process.  She does not have any fever however she is in direct contact with a influenza positive child.  She remains within the treatment window for influenza and proposed treatment with Tamiflu which the patient has excepted.  Otherwise recommending continued use of OTC medication for symptom control.  Final Clinical Impressions(s) / UC Diagnoses   Final diagnoses:  None   Discharge Instructions   None    ED Prescriptions   None    PDMP not reviewed this encounter.   Rose Phi, [place] 02/03/23 1751

## 2023-02-03 NOTE — ED Triage Notes (Signed)
Patient presents to UC for a HA, cough since yesterday. Child tested positive for flu Saturday. Treating symptoms with ibuprofen and cough med.

## 2023-02-03 NOTE — Discharge Instructions (Addendum)
You have been diagnosed with a viral upper respiratory infection based on your symptoms and exam. Viral illnesses cannot be treated with antibiotics - they are self limiting - and you should find your symptoms resolving within a few days. Get plenty of rest and non-caffeinated fluids. Watch for signs of dehydration including reduced urine output and dark colored urine.  I have prescribed Tamiflu, antiviral therapy for influenza A, based on a presumptive diagnosis of influenza.    We recommend you use over-the-counter medications for symptom control including acetaminophen (Tylenol), ibuprofen (Advil/Motrin) or naproxen (Aleve) for throat pain, fever, chills or body aches. You may combine use of acetaminophen and ibuprofen/naproxen if needed.  Some patients find an pain-relieving throat spray such as Chloraseptic to be effective.  Also recommend cold/cough medication containing a cough suppressant such as dextromethorphan, as needed.     Saline mist spray is helpful for removing excess mucus from your nose.  Room humidifiers are helpful to ease breathing at night. I recommend guaifenesin (Mucinex) with plenty of water throughout the day to help thin and loosen mucus secretions in your respiratory passages.   If appropriate based upon your other medical problems, you might also find relief of nasal/sinus congestion symptoms by using a nasal decongestant such as fluticasone (Flonase ) or pseudoephedrine (Sudafed sinus).  You will need to obtain Sudafed from behind the pharmacist counter.  Speak to the pharmacist to verify that you are not duplicating medications with other over-the-counter formulations that you may be using.

## 2023-02-08 ENCOUNTER — Ambulatory Visit
Admission: EM | Admit: 2023-02-08 | Discharge: 2023-02-08 | Disposition: A | Discharge status: Home / Self Care | Payer: No Typology Code available for payment source | Attending: Urgent Care | Admitting: Urgent Care

## 2023-02-08 DIAGNOSIS — H66001 Acute suppurative otitis media without spontaneous rupture of ear drum, right ear: Secondary | ICD-10-CM | POA: Diagnosis not present

## 2023-02-08 DIAGNOSIS — H7291 Unspecified perforation of tympanic membrane, right ear: Secondary | ICD-10-CM

## 2023-02-08 MED ORDER — AMOXICILLIN-POT CLAVULANATE 875-125 MG PO TABS: 1.0000 | ORAL_TABLET | Freq: Two times a day (BID) | ORAL | 0 refills | Status: DC

## 2023-02-08 NOTE — ED Triage Notes (Signed)
Patient presents to [facility] for right ear pain since yesterday. States she has noted ear drainage. Treating pain with tylenol and ibuprofen.   Denies fever.

## 2023-02-08 NOTE — ED Provider Notes (Signed)
[facility]    CSN: [id number] Arrival date & time: 02/08/23  [id number]      History   Chief Complaint Chief Complaint  Patient presents with   Otalgia    HPI Angel Cole is a 32 y.o. female.    Otalgia   Patient presents to urgent care with complaint of right ear pain starting yesterday.  She notes drainage from the ear.  Patient was seen 5 days ago with flulike symptoms and treated presumptively for influenza with Tamiflu given her direct contact with influenza positive child.  She denies fever today.  Presents with cotton ball in her right ear which, when she withdraws it, is soaked with serosanguinous/purulent drainage.  She endorses copious nasal drainage but denies sinus pressure.  Past Medical History:  Diagnosis Date   Obesity     Patient Active Problem List   Diagnosis Date Noted   OTITIS MEDIA, LEFT 07/12/2009    Past Surgical History:  Procedure Laterality Date   THERAPEUTIC ABORTION     TONSILLECTOMY AND ADENOIDECTOMY     age 18    OB History     Gravida  1   Para  1   Term  1   Preterm      AB      Living  1      SAB      IAB      Ectopic      Multiple      Live Births  1            Home Medications    Prior to Admission medications   Medication Sig Start Date End Date Taking? Authorizing Provider  benzonatate (TESSALON) 100 MG capsule Take 1-2 tablets 3 times a day as needed for cough 02/03/23   Harjot Dibello, Annie Main, FNP  famotidine (PEPCID) 20 MG tablet Take 1 tablet (20 mg total) by mouth 2 (two) times daily. 09/28/20 09/28/21  Naaman Plummer, MD  ketorolac (TORADOL) 10 MG tablet Take 10 mg by mouth 3 (three) times daily as needed. 05/25/21   [provider]  oseltamivir (TAMIFLU) 75 MG capsule Take 1 capsule (75 mg total) by mouth every 12 (twelve) hours. 02/03/23   Manley Fason, Annie Main, FNP  sucralfate (CARAFATE) 1 GM/10ML suspension Take 10 mLs (1 g total) by mouth 4 (four) times daily. 09/28/20 09/28/21   Naaman Plummer, MD    Family History Family History  Problem Relation Age of Onset   Hypertension Maternal Grandmother    Diabetes Maternal Grandfather    Hypertension Maternal Grandfather    Hyperlipidemia Maternal Grandfather     Social History Social History   Tobacco Use   Smoking status: Never   Smokeless tobacco: Never  Vaping Use   Vaping Use: Never used  Substance Use Topics   Alcohol use: No   Drug use: No     Allergies   Patient has no known allergies.   Review of Systems Review of Systems  HENT:  Positive for ear pain.      Physical Exam Triage Vital Signs ED Triage Vitals  Enc Vitals Group     BP 02/08/23 1031 124/84     Pulse Rate 02/08/23 1031 85     Resp 02/08/23 1031 18     Temp 02/08/23 1031 98.5 F (36.9 C)     Temp Source 02/08/23 1031 Oral     SpO2 02/08/23 1031 97 %     Weight --  Height --      Head Circumference --      Peak Flow --      Pain Score 02/08/23 1033 10     Pain Loc --      Pain Edu? --      Excl. in [place]? --    No data found.  Updated Vital Signs BP 124/84 (BP Location: Left Arm)   Pulse 85   Temp 98.5 F (36.9 C) (Oral)   Resp 18   SpO2 97%   Visual Acuity Right Eye Distance:   Left Eye Distance:   Bilateral Distance:    Right Eye Near:   Left Eye Near:    Bilateral Near:     Physical Exam Vitals reviewed.  Constitutional:      Appearance: Normal appearance. She is ill-appearing.  HENT:     Right Ear: Decreased hearing noted. Drainage present.  Skin:    General: Skin is warm and dry.  Neurological:     General: No focal deficit present.     Mental Status: She is alert and oriented to person, place, and time.  Psychiatric:        Mood and Affect: Mood normal.        Behavior: Behavior normal.      UC Treatments / Results  Labs (all labs ordered are listed, but only abnormal results are displayed) Labs Reviewed - No data to display  EKG   Radiology No results  found.  Procedures Procedures (including critical care time)  Medications Ordered in UC Medications - No data to display  Initial Impression / Assessment and Plan / UC Course  I have reviewed the triage vital signs and the nursing notes.  Pertinent labs & imaging results that were available during my care of the patient were reviewed by me and considered in my medical decision making (see chart for details).   Patient is afebrile here without recent antipyretics. Satting well on room air. Overall is ill appearing, well hydrated, without respiratory distress. Pulmonary exam is unremarkable.  Lungs CTAB without wheezing, rhonchi, rales.  Possible perforation of right TM.  Purulent drainage is observed in the EAC with decreased hearing and sensation of "crackling" intermittently.  Concern for bacterial sinusitis and right suppurative acute otitis media.  Will treat with antibiotic therapy  Final Clinical Impressions(s) / UC Diagnoses   Final diagnoses:  None   Discharge Instructions   None    ED Prescriptions   None    PDMP not reviewed this encounter.   Rose Phi, [place] 02/08/23 1044

## 2023-02-08 NOTE — Discharge Instructions (Signed)
Follow up here or with your primary care provider if your symptoms are worsening or not improving with treatment.     

## 2023-02-17 ENCOUNTER — Encounter: Payer: Self-pay | Admitting: *Deleted

## 2023-02-17 ENCOUNTER — Emergency Department: Payer: No Typology Code available for payment source

## 2023-02-17 ENCOUNTER — Emergency Department
Admission: EM | Admit: 2023-02-17 | Discharge: 2023-02-18 | Disposition: A | Discharge status: Home / Self Care | Payer: No Typology Code available for payment source | Attending: Emergency Medicine | Admitting: Emergency Medicine

## 2023-02-17 ENCOUNTER — Other Ambulatory Visit: Payer: Self-pay

## 2023-02-17 DIAGNOSIS — R079 Chest pain, unspecified: Secondary | ICD-10-CM

## 2023-02-17 DIAGNOSIS — R059 Cough, unspecified: Secondary | ICD-10-CM | POA: Diagnosis present

## 2023-02-17 DIAGNOSIS — R0602 Shortness of breath: Secondary | ICD-10-CM | POA: Insufficient documentation

## 2023-02-17 DIAGNOSIS — Z20822 Contact with and (suspected) exposure to covid-19: Secondary | ICD-10-CM | POA: Diagnosis not present

## 2023-02-17 DIAGNOSIS — J069 Acute upper respiratory infection, unspecified: Secondary | ICD-10-CM | POA: Diagnosis not present

## 2023-02-17 LAB — CBC
HCT: 37 % (ref 36.0–46.0)
Hemoglobin: 11.1 g/dL — ABNORMAL LOW (ref 12.0–15.0)
MCH: 22.7 pg — ABNORMAL LOW (ref 26.0–34.0)
MCHC: 30 g/dL (ref 30.0–36.0)
MCV: 75.7 fL — ABNORMAL LOW (ref 80.0–100.0)
Platelets: 362 10*3/uL (ref 150–400)
RBC: 4.89 MIL/uL (ref 3.87–5.11)
RDW: 16.7 % — ABNORMAL HIGH (ref 11.5–15.5)
WBC: 10.6 10*3/uL — ABNORMAL HIGH (ref 4.0–10.5)
nRBC: 0 % (ref 0.0–0.2)

## 2023-02-17 LAB — BASIC METABOLIC PANEL
Anion gap: 13 (ref 5–15)
BUN: 11 mg/dL (ref 6–20)
CO2: 23 mmol/L (ref 22–32)
Calcium: 9.3 mg/dL (ref 8.9–10.3)
Chloride: 100 mmol/L (ref 98–111)
Creatinine, Ser: 0.83 mg/dL (ref 0.44–1.00)
GFR, Estimated: >60 mL/min (ref >60)
Glucose, Bld: 151 mg/dL — ABNORMAL HIGH (ref 70–99)
Potassium: 3.7 mmol/L (ref 3.5–5.1)
Sodium: 136 mmol/L (ref 135–145)

## 2023-02-17 LAB — TROPONIN I (HIGH SENSITIVITY): Troponin I (High Sensitivity): 3 ng/L (ref <18)

## 2023-02-17 LAB — POC URINE PREG, ED: Preg Test, Ur: NEGATIVE

## 2023-02-17 MED ORDER — PREDNISONE 20 MG PO TABS: 40.0000 mg | ORAL_TABLET | Freq: Every day | ORAL | 0 refills | Status: AC

## 2023-02-17 MED ORDER — IPRATROPIUM-ALBUTEROL 0.5-2.5 (3) MG/3ML IN SOLN: 3.0000 mL | RESPIRATORY_TRACT | 0 refills | Status: DC | PRN

## 2023-02-17 MED ORDER — IPRATROPIUM-ALBUTEROL 0.5-2.5 (3) MG/3ML IN SOLN
3.0000 mL | Freq: Once | RESPIRATORY_TRACT | Status: AC
  Administered 2023-02-17: 3 mL via RESPIRATORY_TRACT
  Filled 2023-02-17: qty 3

## 2023-02-17 NOTE — Discharge Instructions (Addendum)
Your exam and workup are normal and reassuring at this time.  Follow-up with your primary provider or local urgent care for ongoing symptoms.  Return to the ED if needed.

## 2023-02-17 NOTE — ED Provider Notes (Signed)
[facility] Emergency Department Provider Note     Event Date/Time   First MD Initiated Contact with Patient 02/17/23 2051     (approximate)   History   Chest Pain   HPI  Angel Cole is a 32 y.o. female presents to the ED for evaluation of some chest pain with radiation into the back.  Patient also reported some  nausea, shortness of breath and feelings of increased work of breathing.  Noted symptoms about 20 minutes after using her daughter's albuterol inhaler.  She is also had intermittent cough for the last 3 to 4 days.  Patient was diagnosed with an otitis media treated with Augmentin last week, 2 weeks ago she was treated empirically for a flu positive contact.  Denies any history of asthma or bronchitis.  Patient's mom thought she may have experienced a panic attack, though patient denies any history of the same.  Physical Exam   Triage Vital Signs: ED Triage Vitals  Enc Vitals Group     BP 02/17/23 2000 (!) 157/110     Pulse Rate 02/17/23 2000 (!) 117     Resp 02/17/23 2000 (!) 24     Temp 02/17/23 2000 98.5 F (36.9 C)     Temp Source 02/17/23 2000 Oral     SpO2 02/17/23 2000 97 %     Weight 02/17/23 1957 (!) 332 lb (150.6 kg)     Height 02/17/23 1957 '5\' 4"'$  (1.626 m)     Head Circumference --      Peak Flow --      Pain Score 02/17/23 1956 3     Pain Loc --      Pain Edu? --      Excl. in [place]? --     Most recent vital signs: Vitals:   02/17/23 2135 02/17/23 2138  BP: 137/77   Pulse: (!) 106   Resp:  18  Temp:    SpO2: 100% 100%    General Awake, no distress. NAD HEENT NCAT. PERRL. EOMI. No rhinorrhea. Mucous membranes are moist.  EMs intact bilaterally.  No purulent effusion appreciated. CV:  Good peripheral perfusion. Normal S1S2. No murmurs, rubs, or gallops RESP:  Normal effort. CTA ABD:  No distention.     ED Results / Procedures / Treatments   Labs (all labs ordered are listed, but only abnormal results are  displayed) Labs Reviewed  BASIC METABOLIC PANEL - Abnormal; Notable for the following components:      Result Value   Glucose, Bld 151 (*)    All other components within normal limits  CBC - Abnormal; Notable for the following components:   WBC 10.6 (*)    Hemoglobin 11.1 (*)    MCV 75.7 (*)    MCH 22.7 (*)    RDW 16.7 (*)    All other components within normal limits  POC URINE PREG, ED  TROPONIN I (HIGH SENSITIVITY)     EKG  NSR Vent rate 107 PR Interval 162 ms QRS duration 72 ms ST/QTc 332/443 ms P-R-T axes '30  22  5  '$ RADIOLOGY  I personally viewed and evaluated these images as part of my medical decision making, as well as reviewing the written report by the radiologist.  ED Provider Interpretation: no acute findings  DG Chest 2 View  Result Date: 02/17/2023 CLINICAL DATA:  chest pain EXAM: CHEST - 2 VIEW COMPARISON:  09/11/2018 FINDINGS: Cardiac silhouette is unremarkable. No pneumothorax or pleural effusion. The lungs  are clear. The visualized skeletal structures are unremarkable. IMPRESSION: No acute cardiopulmonary process. Electronically Signed   By: Sammie Bench M.D.   On: 02/17/2023 20:29     PROCEDURES:  Critical Care performed: No  Procedures   MEDICATIONS ORDERED IN ED: Medications  ipratropium-albuterol (DUONEB) 0.5-2.5 (3) MG/3ML nebulizer solution 3 mL (3 mLs Nebulization Given 02/17/23 2256)     IMPRESSION / MDM / ASSESSMENT AND PLAN / ED COURSE  I reviewed the triage vital signs and the nursing notes.                              Differential diagnosis includes, but is not limited to,  ACS, aortic dissection, pulmonary embolism, cardiac tamponade, pneumothorax, pneumonia, pericarditis, myocarditis, GI-related causes including esophagitis/gastritis, and musculoskeletal chest wall pain.    Patient's presentation is most consistent with acute presentation with potential threat to life or bodily function.  Patient to the ED for evaluation  of cough and episodic shortness of breath today.  She felt as if she was going to pass out, but never did.  Patient symptoms resolved when she got to the ED.  She is evaluated for complaints, with reassuring exam and workup overall.  No x-ray evidence of any acute intrathoracic process.  Troponin is negative and EKG is without malignant arrhythmia.  Patient with reassuring vital signs trending in normal direction as well as significant improvement of her symptoms after DuoNeb treatment in the ED.  Low suspicion for ACS given normal cardiac work-up. Low relative risk for PE on Well's criteria.  Patient's diagnosis is consistent with viral URI with cough, nonspecific chest pain. Patient will be discharged home with prescriptions for prednisone and DuoNeb solution. Patient is to follow up with a provider or local urgent care as needed or otherwise directed. Patient is given ED precautions to return to the ED for any worsening or new symptoms.   FINAL CLINICAL IMPRESSION(S) / ED DIAGNOSES   Final diagnoses:  Viral URI with cough  Nonspecific chest pain     Rx / DC Orders   ED Discharge Orders          Ordered    ipratropium-albuterol (DUONEB) 0.5-2.5 (3) MG/3ML SOLN  Every 4 hours PRN        02/17/23 2336    predniSONE (DELTASONE) 20 MG tablet  Daily with breakfast        02/17/23 2336             Note:  This document was prepared using Dragon voice recognition software and may include unintentional dictation errors.

## 2023-02-17 NOTE — ED Triage Notes (Signed)
Pt to triage via wheelchair.  Pt has chest pain radiating into back.  Pt also has sob.  Pt reports a cough.  Pt dx with flu last week.  Pt alert.

## 2023-02-17 NOTE — ED Notes (Signed)
Poct pregnancy Negative

## 2023-02-18 LAB — D-DIMER, QUANTITATIVE: D-Dimer, Quant: 0.32 ug/mL-FEU (ref 0.00–0.50)

## 2023-02-18 NOTE — ED Notes (Signed)
Discharge instructions explained to patient and family at this time. Patient and family state they understand and agree.

## 2023-04-17 ENCOUNTER — Ambulatory Visit (INDEPENDENT_AMBULATORY_CARE_PROVIDER_SITE_OTHER): Payer: No Typology Code available for payment source | Admitting: Nurse Practitioner

## 2023-04-17 ENCOUNTER — Encounter: Payer: Self-pay | Admitting: Nurse Practitioner

## 2023-04-17 VITALS — BP 142/80 | HR 74 | Temp 97.7°F | Ht 64.0 in | Wt 334.8 lb

## 2023-04-17 DIAGNOSIS — F32A Depression, unspecified: Secondary | ICD-10-CM

## 2023-04-17 DIAGNOSIS — R7303 Prediabetes: Secondary | ICD-10-CM

## 2023-04-17 DIAGNOSIS — F419 Anxiety disorder, unspecified: Secondary | ICD-10-CM | POA: Diagnosis not present

## 2023-04-17 DIAGNOSIS — R030 Elevated blood-pressure reading, without diagnosis of hypertension: Secondary | ICD-10-CM

## 2023-04-17 DIAGNOSIS — D649 Anemia, unspecified: Secondary | ICD-10-CM

## 2023-04-17 MED ORDER — SERTRALINE HCL 25 MG PO TABS: 25.0000 mg | ORAL_TABLET | Freq: Every day | ORAL | 3 refills | Status: DC

## 2023-04-17 NOTE — Patient Instructions (Addendum)
Check BP at home daily for 2 weeks and bring to next appointment in 2 weeks.. Fasting labs ordered.     Sertraline Solution What is this medication? SERTRALINE (SER tra leen) treats depression, anxiety, obsessive-compulsive disorder (OCD), post-traumatic stress disorder (PTSD), and premenstrual dysphoric disorder (PMDD). It increases the amount of serotonin in the brain, a hormone that helps regulate mood. It belongs to a group of medications called SSRIs. This medicine may be used for other purposes; ask your health care provider or pharmacist if you have questions. COMMON BRAND NAME(S): Zoloft, Zoloft Concentrate, Zoloft Solution What should I tell my care team before I take this medication? They need to know if you have any of these conditions: Bipolar disorder or a family history of bipolar disorder Bleeding disorders Frequently drink alcohol Glaucoma Heart disease High blood pressure History of irregular heartbeat History of low levels of calcium, magnesium, or potassium in the blood Liver disease Receiving electroconvulsive therapy Seizures Suicidal thoughts, plans, or attempt by you or a family member Take medications that prevent or treat blood clots Thyroid disease An unusual or allergic reaction to sertraline, other medications, foods, dyes, or preservatives Pregnant or trying to get pregnant Breastfeeding How should I use this medication? Take this medication by mouth. Follow the directions on the prescription label. Before taking your dose, you need to dilute the solution in a beverage. Measure your medication dose using the dropper in the bottle. Next, place the measured dose in at least 4 ounces (one-half cup) of water, ginger-ale, lemon-lime soda, lemonade or orange juice and mix. Do not mix in grapefruit juice or in any other liquids other than those listed. Drink all of mixed liquid immediately. Do not mix the dose and store it for later. It must be taken right away.  You may take this medication with or without food. Take your medication at regular intervals. Do not take your medication more often than directed. Do not stop taking this medication suddenly except upon the advice of your care team. Stopping this medication too quickly may cause serious side effects or your condition may worsen. A special MedGuide will be given to you by the pharmacist with each prescription and refill. Be sure to read this information carefully each time. Talk to your care team about the use of this medication in children. While this medication may be prescribed for children as young as 7 years for selected conditions, precautions do apply. Overdosage: If you think you have taken too much of this medicine contact a poison control center or emergency room at once. NOTE: This medicine is only for you. Do not share this medicine with others. What if I miss a dose? If you miss a dose, take it as soon as you can. If it is almost time for your next dose, take only that dose. Do not take double or extra doses. What may interact with this medication? Do not take this medication with any of the following: Cisapride Dronedarone Linezolid MAOIs, such as Carbex, Eldepryl, Marplan, Nardil, and Parnate Methylene blue (injected into a vein) Pimozide Thioridazine This medication may also interact with the following: Alcohol Amphetamines Aspirin and aspirin-like medications Certain medications for fungal infections, such as ketoconazole, fluconazole, posaconazole, itraconazole Certain medications for irregular heart beat, such as flecainide, quinidine, propafenone Certain medications for mental health conditions Certain medications for migraine headaches, such as almotriptan, eletriptan, frovatriptan, naratriptan, rizatriptan, sumatriptan, zolmitriptan Certain medications for seizures, such as carbamazepine, valproic acid, phenytoin Certain medications for sleep Certain medications  that  prevent or treat blood clots, such as warfarin, enoxaparin, dalteparin Cimetidine Digoxin Diuretics Fentanyl Isoniazid Lithium NSAIDs, medications for pain and inflammation, such as ibuprofen or naproxen Other medications that cause heart rhythm changes, such as dofetilide Rasagiline Safinamide Supplements, such as St. John's wort, kava kava, valerian Tolbutamide Tramadol Tryptophan This list may not describe all possible interactions. Give your health care provider a list of all the medicines, herbs, non-prescription drugs, or dietary supplements you use. Also tell them if you smoke, drink alcohol, or use illegal drugs. Some items may interact with your medicine. What should I watch for while using this medication? Visit your care team for regular checks on your progress. Tell your care team if your symptoms do not start to get better or if they get worse. Because it may take several weeks to see the full effects of this medication, it is important to continue your treatment as prescribed by your care team. Patients and their families should watch out for new or worsening thoughts of suicide or depression. Also watch out for sudden changes in feelings, such as feeling anxious, agitated, panicky, irritable, hostile, aggressive, impulsive, severely restless, overly excited and hyperactive, or not being able to sleep. If this happens, especially at the beginning of treatment or after a change in dose, call your care team. This medication may affect your coordination, reaction time, or judgment. Do not drive or operate machinery until you know how this medication affects you. Sit up or stand slowly to reduce the risk of dizzy or fainting spells. Drinking alcohol with this medication can increase the risk of these side effects. Your mouth may get dry. Chewing sugarless gum or sucking hard candy and drinking plenty of water may help. Contact your care team if the problem does not go away or is  severe. Some products may contain alcohol. Ask your pharmacist or care team if this medication contains alcohol. Be sure to tell all healthcare providers you are taking this medication. Certain medications, like metronidazole and disulfiram, can cause an unpleasant reaction when taken with alcohol. The reaction includes flushing, headache, nausea, vomiting, sweating, and increased thirst. The reaction can last from 30 minutes to several hours. What side effects may I notice from receiving this medication? Side effects that you should report to your care team as soon as possible: Allergic reactions--skin rash, itching, hives, swelling of the face, lips, tongue, or throat Bleeding--bloody or black, tar-like stools, red or dark brown urine, vomiting blood or brown material that looks like coffee grounds, small red or purple spots on skin, unusual bleeding or bruising Heart rhythm changes--fast or irregular heartbeat, dizziness, feeling faint or lightheaded, chest pain, trouble breathing Low sodium level--muscle weakness, fatigue, dizziness, headache, confusion Serotonin syndrome--irritability, confusion, fast or irregular heartbeat, muscle stiffness, twitching muscles, sweating, high fever, seizure, chills, vomiting, diarrhea Sudden eye pain or change in vision such as blurred vision, seeing halos around lights, vision loss Thoughts of suicide or self-harm, worsening mood Side effects that usually do not require medical attention (report these to your care team if they continue or are bothersome): Change in sex drive or performance Diarrhea Excessive sweating Nausea Tremors or shaking Upset stomach This list may not describe all possible side effects. Call your doctor for medical advice about side effects. You may report side effects to FDA at 1-800-FDA-1088. Where should I keep my medication? Keep out of the reach of children and pets. Store at room temperature between 20 and 25 degrees C (68 and  77 degrees F). Get rid of any unused medication after the expiration date. To get rid of medications that are no longer needed or expired: Take the medication to a medication take-back program. Check with your pharmacy or law enforcement to find a location. If you cannot return the medication, check the label or package insert to see if the medication should be thrown out in the garbage or flushed down the toilet. If you are not sure, ask your care team. If it is safe to put in the trash, empty the medication out of the container. Mix the medication with cat litter, dirt, coffee grounds, or other unwanted substance. Seal the mixture in a bag or container. Put it in the trash. NOTE: This sheet is a summary. It may not cover all possible information. If you have questions about this medicine, talk to your doctor, pharmacist, or health care provider.  2023 Elsevier/Gold Standard (2022-07-09 00:00:00)

## 2023-04-17 NOTE — Progress Notes (Signed)
Established Patient Office Visit  Subjective:  Patient ID: Angel Cole, female    DOB: 13-Oct-1991  Age: 32 y.o. MRN: [id number]  CC:  Chief Complaint  Patient presents with   Establish Care    HPI  Angel Cole presents for establishing care. Her previous PCP was at [facility].   She lives with her parents and her daughter.  She has history of anemia and prediabetes last HgA1C 6.9 on 10/20/22.   She states that she has a lot of family stress and is trying to move out and live with her sister.  HPI   Past Medical History:  Diagnosis Date   Obesity     Past Surgical History:  Procedure Laterality Date   THERAPEUTIC ABORTION     TONSILLECTOMY AND ADENOIDECTOMY     age 66    Family History  Problem Relation Age of Onset   Hypertension Maternal Grandmother    Diabetes Maternal Grandfather    Hypertension Maternal Grandfather    Hyperlipidemia Maternal Grandfather     Social History   Socioeconomic History   Marital status: Single    Spouse name: Not on file   Number of children: 1   Years of education: 14   Highest education level: Not on file  Occupational History   Occupation: Government social research officer    Employer: [employer]  Tobacco Use   Smoking status: Never   Smokeless tobacco: Never  Vaping Use   Vaping Use: Never used  Substance and Sexual Activity   Alcohol use: No   Drug use: No   Sexual activity: Not Currently    Birth control/protection: None  Other Topics Concern   Not on file  Social History Narrative   Not on file   Social Determinants of Health   Financial Resource Strain: Not on file  Food Insecurity: Not on file  Transportation Needs: Not on file  Physical Activity: Not on file  Stress: Not on file  Social Connections: Not on file  Intimate Partner Violence: Not on file     Outpatient Medications Prior to Visit  Medication Sig Dispense Refill   ipratropium-albuterol (DUONEB) 0.5-2.5 (3) MG/3ML  SOLN Take 3 mLs by nebulization every 4 (four) hours as needed. (Patient not taking: Reported on 04/17/2023) 36 mL 0   amoxicillin-clavulanate (AUGMENTIN) 875-125 MG tablet Take 1 tablet by mouth every 12 (twelve) hours. 14 tablet 0   benzonatate (TESSALON) 100 MG capsule Take 1-2 tablets 3 times a day as needed for cough 30 capsule 0   famotidine (PEPCID) 20 MG tablet Take 1 tablet (20 mg total) by mouth 2 (two) times daily. 60 tablet 1   ketorolac (TORADOL) 10 MG tablet Take 10 mg by mouth 3 (three) times daily as needed.     oseltamivir (TAMIFLU) 75 MG capsule Take 1 capsule (75 mg total) by mouth every 12 (twelve) hours. 10 capsule 0   sucralfate (CARAFATE) 1 GM/10ML suspension Take 10 mLs (1 g total) by mouth 4 (four) times daily. 420 mL 1   No facility-administered medications prior to visit.    No Known Allergies  ROS Review of Systems  Constitutional:  Negative for fever.  Respiratory: Negative.    Cardiovascular: Negative.   Genitourinary: Negative.   Musculoskeletal: Negative.   Neurological: Negative.   Hematological: Negative.   Psychiatric/Behavioral: Negative.        Objective:    Physical Exam Constitutional:      Appearance: Normal appearance. She is obese.  HENT:     Head: Normocephalic.     Right Ear: Tympanic membrane normal.     Left Ear: Tympanic membrane normal.     Nose: Nose normal.     Mouth/Throat:     Mouth: Mucous membranes are moist.     Pharynx: Oropharynx is clear.  Eyes:     Extraocular Movements: Extraocular movements intact.     Conjunctiva/sclera: Conjunctivae normal.     Pupils: Pupils are equal, round, and reactive to light.  Cardiovascular:     Rate and Rhythm: Normal rate and regular rhythm.     Pulses: Normal pulses.     Heart sounds: Normal heart sounds.  Pulmonary:     Effort: Pulmonary effort is normal. No respiratory distress.     Breath sounds: Normal breath sounds. No rhonchi.  Abdominal:     General: Bowel sounds are  normal.     Palpations: Abdomen is soft. There is no mass.     Tenderness: There is no abdominal tenderness.     Hernia: No hernia is present.  Musculoskeletal:        General: Normal range of motion.     Cervical back: Neck supple. No tenderness.  Skin:    General: Skin is warm.     Capillary Refill: Capillary refill takes less than 2 seconds.  Neurological:     General: No focal deficit present.     Mental Status: She is alert and oriented to person, place, and time. Mental status is at baseline.  Psychiatric:        Mood and Affect: Mood normal.        Behavior: Behavior normal.        Thought Content: Thought content normal.        Judgment: Judgment normal.     BP (!) 142/80   Pulse 74   Temp 97.7 F (36.5 C)   Ht 5\' 4"  (1.626 m)   Wt (!) 334 lb 12.8 oz (151.9 kg)   SpO2 97%   BMI 57.47 kg/m  Wt Readings from Last 3 Encounters:  04/17/23 (!) 334 lb 12.8 oz (151.9 kg)  02/17/23 (!) 332 lb (150.6 kg)  05/31/21 (!) 325 lb (147.4 kg)     Health Maintenance  Topic Date Due   HIV Screening  Never done   Hepatitis C Screening  Never done   PAP SMEAR-Modifier  01/16/2021   DTaP/Tdap/Td (8 - Td or Tdap) 11/12/2022   COVID-19 Vaccine (3 - 2023-24 season) 05/03/2023 (Originally 08/23/2022)   INFLUENZA VACCINE  07/24/2023   HPV VACCINES  Aged Out    There are no preventive care reminders to display for this patient.  Lab Results  Component Value Date   TSH 3.04 04/28/2023   Lab Results  Component Value Date   WBC 7.9 04/28/2023   HGB 11.4 (L) 04/28/2023   HCT 35.9 (L) 04/28/2023   MCV 73.3 (L) 04/28/2023   PLT 283.0 04/28/2023   Lab Results  Component Value Date   NA 135 04/28/2023   K 4.1 04/28/2023   CO2 25 04/28/2023   GLUCOSE 102 (H) 04/28/2023   BUN 10 04/28/2023   CREATININE 0.74 04/28/2023   BILITOT 0.5 04/28/2023   ALKPHOS 156 (H) 04/28/2023   AST 17 04/28/2023   ALT 17 04/28/2023   PROT 8.1 04/28/2023   ALBUMIN 3.8 04/28/2023   CALCIUM 8.9  04/28/2023   ANIONGAP 13 02/17/2023   GFR 107.80 04/28/2023   Lab Results  Component Value  Date   CHOL 159 04/28/2023   Lab Results  Component Value Date   HDL 39.80 04/28/2023   Lab Results  Component Value Date   LDLCALC 85 04/28/2023   Lab Results  Component Value Date   TRIG 173.0 (H) 04/28/2023   Lab Results  Component Value Date   CHOLHDL 4 04/28/2023   Lab Results  Component Value Date   HGBA1C 7.0 (H) 04/28/2023      Assessment & Plan:  Prediabetes Assessment & Plan: Advised pt to monitor diet. Advised pt to eat variety of food including fruits, vegetables, whole grains, complex carbohydrates and proteins.  Check hemoglobin A1c  Orders: -     CBC with Differential/Platelet; Future -     Comprehensive metabolic panel; Future -     Lipid panel; Future -     TSH; Future -     Hemoglobin A1c; Future  Anemia, unspecified type Assessment & Plan: Will check CBC and iron panel for anemia. Advised patient to consume food rich in iron.    Anxiety and depression Assessment & Plan: Her PHQ-9 score: 12 and GAD score: 8. Will start her on Zoloft 25 mg   Morbid obesity (HCC) Assessment & Plan: Body mass index is 57.47 kg/m. Advised pt to lose weight. Advised patient to avoid trans fat, fatty and fried food. Follow a regular physical activity schedule. Went over the risk of chronic diseases with increased weight.       Elevated BP without diagnosis of hypertension Assessment & Plan: Patient BP  Vitals:   04/17/23 1004  BP: (!) 142/80    in the office. Advised pt to follow a low sodium and heart healthy diet. Advised patient to check blood pressure at home and bring readings to the next appointment if needed will start her on blood pressure medication.   Other orders -     Sertraline HCl; Take 1 tablet (25 mg total) by mouth daily.  Dispense: 30 tablet; Refill: 3    Follow-up: Return in about 2 weeks (around 05/01/2023) for elavarted bp and  depression.   Kara Dies, NP

## 2023-04-28 ENCOUNTER — Other Ambulatory Visit (INDEPENDENT_AMBULATORY_CARE_PROVIDER_SITE_OTHER): Payer: No Typology Code available for payment source

## 2023-04-28 DIAGNOSIS — F419 Anxiety disorder, unspecified: Secondary | ICD-10-CM | POA: Insufficient documentation

## 2023-04-28 DIAGNOSIS — D649 Anemia, unspecified: Secondary | ICD-10-CM

## 2023-04-28 DIAGNOSIS — R7303 Prediabetes: Secondary | ICD-10-CM | POA: Insufficient documentation

## 2023-04-28 DIAGNOSIS — R030 Elevated blood-pressure reading, without diagnosis of hypertension: Secondary | ICD-10-CM | POA: Insufficient documentation

## 2023-04-28 DIAGNOSIS — E119 Type 2 diabetes mellitus without complications: Secondary | ICD-10-CM | POA: Insufficient documentation

## 2023-04-28 LAB — COMPREHENSIVE METABOLIC PANEL
ALT: 17 U/L (ref 0–35)
AST: 17 U/L (ref 0–37)
Albumin: 3.8 g/dL (ref 3.5–5.2)
Alkaline Phosphatase: 156 U/L — ABNORMAL HIGH (ref 39–117)
BUN: 10 mg/dL (ref 6–23)
CO2: 25 mEq/L (ref 19–32)
Calcium: 8.9 mg/dL (ref 8.4–10.5)
Chloride: 100 mEq/L (ref 96–112)
Creatinine, Ser: 0.74 mg/dL (ref 0.40–1.20)
GFR: 107.8 mL/min (ref >60.00)
Glucose, Bld: 102 mg/dL — ABNORMAL HIGH (ref 70–99)
Potassium: 4.1 mEq/L (ref 3.5–5.1)
Sodium: 135 mEq/L (ref 135–145)
Total Bilirubin: 0.5 mg/dL (ref 0.2–1.2)
Total Protein: 8.1 g/dL (ref 6.0–8.3)

## 2023-04-28 LAB — TSH: TSH: 3.04 u[IU]/mL (ref 0.35–5.50)

## 2023-04-28 LAB — CBC WITH DIFFERENTIAL/PLATELET
Basophils Absolute: 0 10*3/uL (ref 0.0–0.1)
Basophils Relative: 0.2 % (ref 0.0–3.0)
Eosinophils Absolute: 0.2 10*3/uL (ref 0.0–0.7)
Eosinophils Relative: 2.3 % (ref 0.0–5.0)
HCT: 35.9 % — ABNORMAL LOW (ref 36.0–46.0)
Hemoglobin: 11.4 g/dL — ABNORMAL LOW (ref 12.0–15.0)
Lymphocytes Relative: 35.3 % (ref 12.0–46.0)
Lymphs Abs: 2.8 10*3/uL (ref 0.7–4.0)
MCHC: 31.8 g/dL (ref 30.0–36.0)
MCV: 73.3 fl — ABNORMAL LOW (ref 78.0–100.0)
Monocytes Absolute: 0.3 10*3/uL (ref 0.1–1.0)
Monocytes Relative: 4.4 % (ref 3.0–12.0)
Neutro Abs: 4.6 10*3/uL (ref 1.4–7.7)
Neutrophils Relative %: 57.8 % (ref 43.0–77.0)
Platelets: 283 10*3/uL (ref 150.0–400.0)
RBC: 4.9 Mil/uL (ref 3.87–5.11)
RDW: 16.6 % — ABNORMAL HIGH (ref 11.5–15.5)
WBC: 7.9 10*3/uL (ref 4.0–10.5)

## 2023-04-28 LAB — IBC + FERRITIN
Ferritin: 40.7 ng/mL (ref 10.0–291.0)
Iron: 55 ug/dL (ref 42–145)
Saturation Ratios: 13.2 % — ABNORMAL LOW (ref 20.0–50.0)
TIBC: 415.8 ug/dL (ref 250.0–450.0)
Transferrin: 297 mg/dL (ref 212.0–360.0)

## 2023-04-28 LAB — LIPID PANEL
Cholesterol: 159 mg/dL (ref 0–200)
HDL: 39.8 mg/dL (ref >39.00)
LDL Cholesterol: 85 mg/dL (ref 0–99)
NonHDL: 119.39
Total CHOL/HDL Ratio: 4
Triglycerides: 173 mg/dL — ABNORMAL HIGH (ref 0.0–149.0)
VLDL: 34.6 mg/dL (ref 0.0–40.0)

## 2023-04-28 LAB — HEMOGLOBIN A1C: Hgb A1c MFr Bld: 7 % — ABNORMAL HIGH (ref 4.6–6.5)

## 2023-04-28 NOTE — Assessment & Plan Note (Signed)
>>  ASSESSMENT AND PLAN FOR PREDIABETES WRITTEN ON 04/28/2023  8:49 PM BY Kara Dies, NP  Advised pt to monitor diet. Advised pt to eat variety of food including fruits, vegetables, whole grains, complex carbohydrates and proteins.  Check hemoglobin A1c

## 2023-04-28 NOTE — Assessment & Plan Note (Signed)
Body mass index is 57.47 kg/m. Advised pt to lose weight. Advised patient to avoid trans fat, fatty and fried food. Follow a regular physical activity schedule. Went over the risk of chronic diseases with increased weight.

## 2023-04-28 NOTE — Assessment & Plan Note (Signed)
Patient BP  Vitals:   04/17/23 1004  BP: (!) 142/80    in the office. Advised pt to follow a low sodium and heart healthy diet. Advised patient to check blood pressure at home and bring readings to the next appointment if needed will start her on blood pressure medication.

## 2023-04-28 NOTE — Assessment & Plan Note (Signed)
Advised pt to monitor diet. Advised pt to eat variety of food including fruits, vegetables, whole grains, complex carbohydrates and proteins.  Check hemoglobin A1c

## 2023-04-28 NOTE — Assessment & Plan Note (Signed)
Will check CBC and iron panel for anemia. Advised patient to consume food rich in iron.

## 2023-04-28 NOTE — Assessment & Plan Note (Signed)
Her PHQ-9 score: 12 and GAD score: 8. Will start her on Zoloft 25 mg

## 2023-05-01 ENCOUNTER — Encounter: Payer: Self-pay | Admitting: Nurse Practitioner

## 2023-05-01 ENCOUNTER — Ambulatory Visit (INDEPENDENT_AMBULATORY_CARE_PROVIDER_SITE_OTHER): Payer: No Typology Code available for payment source | Admitting: Nurse Practitioner

## 2023-05-01 VITALS — BP 132/88 | HR 92 | Ht 64.0 in | Wt 331.0 lb

## 2023-05-01 DIAGNOSIS — R748 Abnormal levels of other serum enzymes: Secondary | ICD-10-CM

## 2023-05-01 DIAGNOSIS — Z7984 Long term (current) use of oral hypoglycemic drugs: Secondary | ICD-10-CM | POA: Diagnosis not present

## 2023-05-01 DIAGNOSIS — K760 Fatty (change of) liver, not elsewhere classified: Secondary | ICD-10-CM

## 2023-05-01 DIAGNOSIS — D649 Anemia, unspecified: Secondary | ICD-10-CM

## 2023-05-01 DIAGNOSIS — Z79899 Other long term (current) drug therapy: Secondary | ICD-10-CM

## 2023-05-01 DIAGNOSIS — E119 Type 2 diabetes mellitus without complications: Secondary | ICD-10-CM

## 2023-05-01 DIAGNOSIS — N912 Amenorrhea, unspecified: Secondary | ICD-10-CM

## 2023-05-01 DIAGNOSIS — E781 Pure hyperglyceridemia: Secondary | ICD-10-CM | POA: Diagnosis not present

## 2023-05-01 MED ORDER — SEMAGLUTIDE(0.25 OR 0.5MG/DOS) 2 MG/3ML ~~LOC~~ SOPN: 0.2500 mg | PEN_INJECTOR | SUBCUTANEOUS | 2 refills | Status: DC

## 2023-05-01 MED ORDER — METFORMIN HCL ER 500 MG PO TB24: 500.0000 mg | ORAL_TABLET | Freq: Every day | ORAL | 1 refills | Status: DC

## 2023-05-01 NOTE — Progress Notes (Signed)
Established Patient Office Visit  Subjective:  Patient ID: Angel Cole, female    DOB: [birth year]  Age: 32 y.o. MRN: [id number]  CC:  Chief Complaint  Patient presents with   Medical Management of Chronic Issues   Hypertension    Has been taking readings at home, highest 134/108    HPI  Angel Cole presents for follow-up on lab and follow-up on blood pressure. Her blood pressure readings at home 104/79, 108/79, 116/76, 134/108,112/80,108/79, 115/77, 120/91, 107/83.  Patient also states that her last LMP was in November 2023.  Patient states that she is not sexually active and would like to see GYN.   HPI   Past Medical History:  Diagnosis Date   Obesity     Past Surgical History:  Procedure Laterality Date   THERAPEUTIC ABORTION     TONSILLECTOMY AND ADENOIDECTOMY     age 38    Family History  Problem Relation Age of Onset   Hypertension Maternal Grandmother    Diabetes Maternal Grandfather    Hypertension Maternal Grandfather    Hyperlipidemia Maternal Grandfather     Social History   Socioeconomic History   Marital status: Single    Spouse name: Not on file   Number of children: 1   Years of education: 14   Highest education level: Not on file  Occupational History   Occupation: Government social research officer    Employer: [employer]  Tobacco Use   Smoking status: Never   Smokeless tobacco: Never  Vaping Use   Vaping Use: Never used  Substance and Sexual Activity   Alcohol use: No   Drug use: No   Sexual activity: Not Currently    Birth control/protection: None  Other Topics Concern   Not on file  Social History Narrative   Not on file   Social Determinants of Health   Financial Resource Strain: Not on file  Food Insecurity: Not on file  Transportation Needs: Not on file  Physical Activity: Not on file  Stress: Not on file  Social Connections: Not on file  Intimate Partner Violence: Not on file     Outpatient Medications Prior to  Visit  Medication Sig Dispense Refill   sertraline (ZOLOFT) 25 MG tablet Take 1 tablet (25 mg total) by mouth daily. 30 tablet 3   ipratropium-albuterol (DUONEB) 0.5-2.5 (3) MG/3ML SOLN Take 3 mLs by nebulization every 4 (four) hours as needed. (Patient not taking: Reported on 04/17/2023) 36 mL 0   No facility-administered medications prior to visit.    No Known Allergies  ROS Review of Systems  All other systems reviewed and are negative.     Objective:    Physical Exam Constitutional:      Appearance: Normal appearance. She is obese.  HENT:     Head: Normocephalic.  Eyes:     Pupils: Pupils are equal, round, and reactive to light.  Cardiovascular:     Rate and Rhythm: Normal rate and regular rhythm.     Pulses: Normal pulses.     Heart sounds: Normal heart sounds.  Pulmonary:     Effort: Pulmonary effort is normal. No respiratory distress.     Breath sounds: Normal breath sounds. No rhonchi.  Musculoskeletal:        General: Normal range of motion.     Cervical back: Neck supple. No tenderness.  Skin:    General: Skin is warm.     Capillary Refill: Capillary refill takes less than 2 seconds.  Neurological:  General: No focal deficit present.     Mental Status: She is alert and oriented to person, place, and time. Mental status is at baseline.  Psychiatric:        Mood and Affect: Mood normal.        Behavior: Behavior normal.        Thought Content: Thought content normal.        Judgment: Judgment normal.     BP 132/88   Pulse 92   Ht 5\' 4"  (1.626 m)   Wt (!) 331 lb (150.1 kg)   SpO2 97%   BMI 56.82 kg/m  Wt Readings from Last 3 Encounters:  05/01/23 (!) 331 lb (150.1 kg)  04/17/23 (!) 334 lb 12.8 oz (151.9 kg)  02/17/23 (!) 332 lb (150.6 kg)     Health Maintenance  Topic Date Due   FOOT EXAM  Never done   OPHTHALMOLOGY EXAM  Never done   HIV Screening  Never done   Diabetic kidney evaluation - Urine ACR  Never done   Hepatitis C Screening   Never done   PAP SMEAR-Modifier  01/16/2021   COVID-19 Vaccine (3 - 2023-24 season) 08/23/2022   DTaP/Tdap/Td (8 - Td or Tdap) 11/12/2022   INFLUENZA VACCINE  07/24/2023   HEMOGLOBIN A1C  10/29/2023   Diabetic kidney evaluation - eGFR measurement  04/27/2024   HPV VACCINES  Aged Out    There are no preventive care reminders to display for this patient.  Lab Results  Component Value Date   TSH 3.04 04/28/2023   Lab Results  Component Value Date   WBC 7.9 04/28/2023   HGB 11.4 (L) 04/28/2023   HCT 35.9 (L) 04/28/2023   MCV 73.3 (L) 04/28/2023   PLT 283.0 04/28/2023   Lab Results  Component Value Date   NA 135 04/28/2023   K 4.1 04/28/2023   CO2 25 04/28/2023   GLUCOSE 102 (H) 04/28/2023   BUN 10 04/28/2023   CREATININE 0.74 04/28/2023   BILITOT 0.5 04/28/2023   ALKPHOS 156 (H) 04/28/2023   AST 17 04/28/2023   ALT 17 04/28/2023   PROT 8.1 04/28/2023   ALBUMIN 3.8 04/28/2023   CALCIUM 8.9 04/28/2023   ANIONGAP 13 02/17/2023   GFR 107.80 04/28/2023   Lab Results  Component Value Date   CHOL 159 04/28/2023   Lab Results  Component Value Date   HDL 39.80 04/28/2023   Lab Results  Component Value Date   LDLCALC 85 04/28/2023   Lab Results  Component Value Date   TRIG 173.0 (H) 04/28/2023   Lab Results  Component Value Date   CHOLHDL 4 04/28/2023   Lab Results  Component Value Date   HGBA1C 7.0 (H) 04/28/2023      Assessment & Plan:  Diabetes mellitus treated with injections of non-insulin medication (HCC) Assessment & Plan: Hemoglobin A1c from 6.3- 7.0. Nutrition counseling provided. Started on metformin 500 mg at breakfast and semaglutide 0.25 Mg once weekly.   Diabetes mellitus treated with oral medication (HCC) Assessment & Plan: Hemoglobin A1c from 6.3- 7.0. Nutrition counseling provided. Started on metformin 500 mg at breakfast and semaglutide 0.25 Mg once weekly.  Orders: -     Hemoglobin A1c; Future  Anemia, unspecified  type Assessment & Plan: Patient hemoglobin A1c 11.4, hematocrit 35.9, MCV 73.3 and Iron saturation 13.2 Advised patient to take over-the-counter iron supplement with food. Will repeat labs in 4 months.   High triglycerides  Elevated alkaline phosphatase level Assessment & Plan: Alkaline  phosphate 156. Referral sent to GI. Nutrition counseling provided.  Orders: -     Hepatic function panel; Future -     Ambulatory referral to Gastroenterology  Non-alcoholic fatty liver disease Assessment & Plan: Alkaline phosphate 156. Reviewed previous CT results from 2022 CT results showed hepatomegaly severe diffuse hepatic to stenosis. Referral sent for GI consult.  Orders: -     Ambulatory referral to Gastroenterology  Amenorrhea Assessment & Plan: Referral sent to GYN  Orders: -     Ambulatory referral to Gynecology  Other orders -     metFORMIN HCl ER; Take 1 tablet (500 mg total) by mouth daily with breakfast.  Dispense: 30 tablet; Refill: 1 -     Semaglutide(0.25 or 0.5MG /DOS); Inject 0.25 mg into the skin once a week.  Dispense: 3 mL; Refill: 2    Follow-up: Return in about 3 months (around 08/01/2023) for diabetes.   Kara Dies, NP

## 2023-05-01 NOTE — Patient Instructions (Addendum)
Started on metformin once a day. Referral sent to GI Check BP 2-3 times a week at the same time and bring the readings during next appointment. Labs before next visit. Watch diet and regular exercise.

## 2023-05-09 ENCOUNTER — Other Ambulatory Visit: Payer: Self-pay | Admitting: Nurse Practitioner

## 2023-05-13 ENCOUNTER — Ambulatory Visit
Admission: EM | Admit: 2023-05-13 | Discharge: 2023-05-13 | Disposition: A | Discharge status: Home / Self Care | Payer: No Typology Code available for payment source | Attending: Urgent Care | Admitting: Urgent Care

## 2023-05-13 DIAGNOSIS — R6889 Other general symptoms and signs: Secondary | ICD-10-CM | POA: Diagnosis present

## 2023-05-13 DIAGNOSIS — R509 Fever, unspecified: Secondary | ICD-10-CM | POA: Diagnosis not present

## 2023-05-13 DIAGNOSIS — Z1152 Encounter for screening for COVID-19: Secondary | ICD-10-CM | POA: Insufficient documentation

## 2023-05-13 LAB — POCT RAPID STREP A (OFFICE): Rapid Strep A Screen: NEGATIVE

## 2023-05-13 NOTE — ED Triage Notes (Signed)
Patient presents to UC for body aches, nasal congestion, and fever since 1 day. Taking OTC cold meds and ibuprofen.

## 2023-05-13 NOTE — ED Provider Notes (Signed)
[facility]    CSN: [id number] Arrival date & time: 05/13/23  1349      History   Chief Complaint Chief Complaint  Patient presents with   Fever   Nasal Congestion   Generalized Body Aches    HPI Angel Cole is a 32 y.o. female.    Fever   Presents to urgent care with complaint of body aches, nasal congestion, fever x 1 day.  She endorses temperature of 101 F measured at home.  Denies GI symptoms.  Denies cough.  Past Medical History:  Diagnosis Date   Obesity     Patient Active Problem List   Diagnosis Date Noted   Anxiety and depression 04/28/2023   Anemia 04/28/2023   Prediabetes 04/28/2023   Morbid obesity (HCC) 04/28/2023   Elevated BP without diagnosis of hypertension 04/28/2023   OTITIS MEDIA, LEFT 07/12/2009    Past Surgical History:  Procedure Laterality Date   THERAPEUTIC ABORTION     TONSILLECTOMY AND ADENOIDECTOMY     age 63    OB History     Gravida  1   Para  1   Term  1   Preterm      AB      Living  1      SAB      IAB      Ectopic      Multiple      Live Births  1            Home Medications    Prior to Admission medications   Medication Sig Start Date End Date Taking? Authorizing Provider  ipratropium-albuterol (DUONEB) 0.5-2.5 (3) MG/3ML SOLN Take 3 mLs by nebulization every 4 (four) hours as needed. Patient not taking: Reported on 04/17/2023 02/17/23   Menshew, Charlesetta Ivory, PA-C  metFORMIN (GLUCOPHAGE-XR) 500 MG 24 hr tablet Take 1 tablet (500 mg total) by mouth daily with breakfast. 05/01/23   Kara Dies, NP  Semaglutide,0.25 or 0.5MG /DOS, 2 MG/3ML SOPN Inject 0.25 mg into the skin once a week. 05/01/23   Kara Dies, NP  sertraline (ZOLOFT) 25 MG tablet TAKE 1 TABLET (25 MG TOTAL) BY MOUTH DAILY. 05/09/23   Kara Dies, NP    Family History Family History  Problem Relation Age of Onset   Hypertension Maternal Grandmother    Diabetes Maternal Grandfather    Hypertension  Maternal Grandfather    Hyperlipidemia Maternal Grandfather     Social History Social History   Tobacco Use   Smoking status: Never   Smokeless tobacco: Never  Vaping Use   Vaping Use: Never used  Substance Use Topics   Alcohol use: No   Drug use: No     Allergies   Patient has no known allergies.   Review of Systems Review of Systems  Constitutional:  Positive for fever.     Physical Exam Triage Vital Signs ED Triage Vitals  Enc Vitals Group     BP      Pulse      Resp      Temp      Temp src      SpO2      Weight      Height      Head Circumference      Peak Flow      Pain Score      Pain Loc      Pain Edu?      Excl. in GC?    No data  found.  Updated Vital Signs There were no vitals taken for this visit.  Visual Acuity Right Eye Distance:   Left Eye Distance:   Bilateral Distance:    Right Eye Near:   Left Eye Near:    Bilateral Near:     Physical Exam Vitals reviewed.  Constitutional:      Appearance: Normal appearance. She is ill-appearing.  HENT:     Right Ear: Tympanic membrane normal.     Left Ear: Tympanic membrane normal.     Nose: Congestion present.     Mouth/Throat:     Pharynx: Posterior oropharyngeal erythema present. No oropharyngeal exudate.  Cardiovascular:     Rate and Rhythm: Normal rate and regular rhythm.     Pulses: Normal pulses.     Heart sounds: Normal heart sounds.  Pulmonary:     Effort: Pulmonary effort is normal.     Breath sounds: Normal breath sounds.  Skin:    General: Skin is warm and dry.  Neurological:     General: No focal deficit present.     Mental Status: She is alert and oriented to person, place, and time.  Psychiatric:        Mood and Affect: Mood normal.        Behavior: Behavior normal.      UC Treatments / Results  Labs (all labs ordered are listed, but only abnormal results are displayed) Labs Reviewed - No data to display  EKG   Radiology No results  found.  Procedures Procedures (including critical care time)  Medications Ordered in UC Medications - No data to display  Initial Impression / Assessment and Plan / UC Course  I have reviewed the triage vital signs and the nursing notes.  Pertinent labs & imaging results that were available during my care of the patient were reviewed by me and considered in my medical decision making (see chart for details).   Angel Cole is a 32 y.o. female presenting with flu like symptoms. Patient is afebrile without recent antipyretics, satting well on room air. Overall is ill appearing though non-toxic, well hydrated, without respiratory distress. Pulmonary exam is unremarkable.  Lungs CTAB without wheezing, rhonchi, rales. RRR.  There is nasal congestion.  Some pharyngeal erythema.  No peritonsillar exudate.  TMs are WNL bilaterally.  Rapid strep result is negative.  COVID swab obtained and results pending.  Recommending supportive care and use of OTC medication for symptom control.  Reviewed chart history.   Counseled patient on potential for adverse effects with medications prescribed/recommended today, ER and return-to-clinic precautions discussed, patient verbalized understanding and agreement with care plan.   Final Clinical Impressions(s) / UC Diagnoses   Final diagnoses:  None   Discharge Instructions   None    ED Prescriptions   None    PDMP not reviewed this encounter.   Charma Igo, [place] 05/13/23 1436

## 2023-05-13 NOTE — Discharge Instructions (Addendum)
Follow up here or with your primary care provider if your symptoms are worsening or not improving.     

## 2023-05-14 LAB — SARS CORONAVIRUS 2 (TAT 6-24 HRS): SARS Coronavirus 2: NEGATIVE

## 2023-05-18 DIAGNOSIS — N912 Amenorrhea, unspecified: Secondary | ICD-10-CM | POA: Insufficient documentation

## 2023-05-18 DIAGNOSIS — E781 Pure hyperglyceridemia: Secondary | ICD-10-CM | POA: Insufficient documentation

## 2023-05-18 DIAGNOSIS — K760 Fatty (change of) liver, not elsewhere classified: Secondary | ICD-10-CM | POA: Insufficient documentation

## 2023-05-18 DIAGNOSIS — R748 Abnormal levels of other serum enzymes: Secondary | ICD-10-CM | POA: Insufficient documentation

## 2023-05-18 NOTE — Assessment & Plan Note (Signed)
Hemoglobin A1c from 6.3- 7.0. Nutrition counseling provided. Started on metformin 500 mg at breakfast and semaglutide 0.25 Mg once weekly.

## 2023-05-18 NOTE — Assessment & Plan Note (Signed)
Referral sent to GYN

## 2023-05-18 NOTE — Assessment & Plan Note (Signed)
Alkaline phosphate 156. Reviewed previous CT results from 2022 CT results showed hepatomegaly severe diffuse hepatic to stenosis. Referral sent for GI consult.

## 2023-05-18 NOTE — Assessment & Plan Note (Signed)
Alkaline phosphate 156. Referral sent to GI. Nutrition counseling provided.

## 2023-05-18 NOTE — Assessment & Plan Note (Addendum)
Patient hemoglobin A1c 11.4, hematocrit 35.9, MCV 73.3 and Iron saturation 13.2 Advised patient to take over-the-counter iron supplement with food. Will repeat labs in 4 months.

## 2023-05-23 ENCOUNTER — Other Ambulatory Visit: Payer: Self-pay | Admitting: Nurse Practitioner

## 2023-05-29 ENCOUNTER — Encounter: Payer: No Typology Code available for payment source | Admitting: Certified Nurse Midwife

## 2023-06-11 ENCOUNTER — Encounter: Payer: Self-pay | Admitting: Certified Nurse Midwife

## 2023-06-11 ENCOUNTER — Ambulatory Visit (INDEPENDENT_AMBULATORY_CARE_PROVIDER_SITE_OTHER): Payer: 59 | Admitting: Certified Nurse Midwife

## 2023-06-11 VITALS — BP 139/91 | HR 75 | Ht 64.0 in | Wt 326.0 lb

## 2023-06-11 DIAGNOSIS — N912 Amenorrhea, unspecified: Secondary | ICD-10-CM | POA: Diagnosis not present

## 2023-06-11 DIAGNOSIS — Z7689 Persons encountering health services in other specified circumstances: Secondary | ICD-10-CM

## 2023-06-11 NOTE — Progress Notes (Signed)
GYN ENCOUNTER NOTE  Subjective:       Angel Cole is a 32 y.o. G24P1001 female is here for gynecologic evaluation of the following issues:  1. Amenorrhea , pt states that she has started having skipping cycles in January 2020. She states she can go a year without having a period.She had one May 2023, had one this past may after start ozempic. Marland Kitchen     Gynecologic History No LMP recorded (lmp unknown). (Menstrual status: Irregular Periods). Contraception: none Last Pap: 2019. Results were: normal Last mammogram: n/a .   Obstetric History OB History  Gravida Para Term Preterm AB Living  1 1 1     1   SAB IAB Ectopic Multiple Live Births          1    # Outcome Date GA Lbr Len/2nd Weight Sex Delivery Anes PTL Lv  1 Term 12/26/12 [redacted]w[redacted]d  6 lb 12 oz (3.062 kg) F Vag-Spont   LIV    Past Medical History:  Diagnosis Date   Obesity     Past Surgical History:  Procedure Laterality Date   THERAPEUTIC ABORTION     TONSILLECTOMY AND ADENOIDECTOMY     age 19    Current Outpatient Medications on File Prior to Visit  Medication Sig Dispense Refill   metFORMIN (GLUCOPHAGE-XR) 500 MG 24 hr tablet TAKE 1 TABLET BY MOUTH EVERY DAY WITH BREAKFAST 90 tablet 1   Semaglutide,0.25 or 0.5MG /DOS, 2 MG/3ML SOPN Inject 0.25 mg into the skin once a week. 3 mL 2   sertraline (ZOLOFT) 25 MG tablet TAKE 1 TABLET (25 MG TOTAL) BY MOUTH DAILY. 90 tablet 2   No current facility-administered medications on file prior to visit.    No Known Allergies  Social History   Socioeconomic History   Marital status: Single    Spouse name: Not on file   Number of children: 1   Years of education: 14   Highest education level: Not on file  Occupational History   Occupation: office assistant    Employer: [employer]  Tobacco Use   Smoking status: Never   Smokeless tobacco: Never  Vaping Use   Vaping Use: Never used  Substance and Sexual Activity   Alcohol use: No   Drug use: No   Sexual activity:  Not Currently    Birth control/protection: None  Other Topics Concern   Not on file  Social History Narrative   Not on file   Social Determinants of Health   Financial Resource Strain: Not on file  Food Insecurity: Not on file  Transportation Needs: Not on file  Physical Activity: Not on file  Stress: Not on file  Social Connections: Not on file  Intimate Partner Violence: Not on file    Family History  Problem Relation Age of Onset   Hypertension Maternal Grandmother    Diabetes Maternal Grandfather    Hypertension Maternal Grandfather    Hyperlipidemia Maternal Grandfather     The following portions of the patient's history were reviewed and updated as appropriate: allergies, current medications, past family history, past medical history, past social history, past surgical history and problem list.  Review of Systems Review of Systems - Negative except as mentioned in hpi Review of Systems - General ROS: negative for - chills, fatigue, fever, hot flashes, malaise or night sweats Hematological and Lymphatic ROS: negative for - bleeding problems or swollen lymph nodes Gastrointestinal ROS: negative for - abdominal pain, blood in stools, change in bowel habits  and nausea/vomiting Musculoskeletal ROS: negative for - joint pain, muscle pain or muscular weakness Genito-Urinary ROS: negative for - change in menstrual cycle, dysmenorrhea, dyspareunia, dysuria, genital discharge, genital ulcers, hematuria, incontinence, heavy menses, nocturia or pelvic pain. Positive for irregular cycles  Objective:   BP (!) 139/91   Pulse 75   Ht 5\' 4"  (1.626 m)   Wt (!) 326 lb (147.9 kg)   LMP  (LMP Unknown)   BMI 55.96 kg/m  CONSTITUTIONAL: Well-developed, obese, well-nourished female in no acute distress.  HENT:  Normocephalic, atraumatic.  NECK: Normal range of motion, supple, no masses.  Normal thyroid.  SKIN: Skin is warm and dry. No rash noted. Not diaphoretic. No erythema. No  pallor. NEUROLGIC: Alert and oriented to person, place, and time. PSYCHIATRIC: Normal mood and affect. Normal behavior. Normal judgment and thought content. CARDIOVASCULAR:Not Examined RESPIRATORY: Not Examined BREASTS: Not Examined ABDOMEN: Soft, non distended; Non tender.  No Organomegaly. PELVIC:not indicated  MUSCULOSKELETAL: Normal range of motion. No tenderness.  No cyanosis, clubbing, or edema.     Assessment:   1. Encounter to establish care  2. Amenorrhea      Plan:   Discussed cause of amenorrhea including obesity and PCOS. Discussed lab testing and u/s for further evaluation. Pt is in agreement. Discussed treatment options including birth control or monthly progesterone. Labs and u/s ordered will follow up with results.  Face to face time 15 min.  Follow up prn for annual exam/pap  Doreene Burke, CNM

## 2023-06-11 NOTE — Patient Instructions (Signed)
Polycystic Ovary Syndrome  Polycystic ovarian syndrome (PCOS) is a common hormonal disorder among women of reproductive age. In most women with PCOS, small fluid-filled sacs (cysts) grow on the ovaries. PCOS can cause problems with menstrual periods and make it hard to get and stay pregnant. If this condition is not treated, it can lead to serious health problems, such as diabetes and heart disease. What are the causes? The cause of this condition is not known. It may be due to certain factors, such as: Irregular menstrual cycle. High levels of certain hormones. Problems with the hormone that helps to control blood sugar (insulin). Certain genes. What increases the risk? You are more likely to develop this condition if you: Have a family history of PCOS or type 2 diabetes. Are overweight, eat unhealthy foods, and are not active. These factors may cause problems with blood sugar control, which can contribute to PCOS or PCOS symptoms. What are the signs or symptoms? Symptoms of this condition include: Ovarian cysts and sometimes pelvic pain. Menstrual periods that are not regular or are too heavy. Inability to get or stay pregnant. Increased growth of hair on the face, chest, stomach, back, thumbs, thighs, or toes. Acne or oily skin. Acne may develop during adulthood, and it may not get better with treatment. Weight gain or obesity. Patches of thickened and dark brown or black skin on the neck, arms, breasts, or thighs. How is this diagnosed? This condition is diagnosed based on: Your medical history. A physical exam that includes a pelvic exam. Your health care provider may look for areas of increased hair growth on your skin. Tests, such as: An ultrasound to check the ovaries for cysts and to view the lining of the uterus. Blood tests to check levels of sugar (glucose), female hormone (testosterone), and female hormones (estrogen and progesterone). How is this treated? There is no cure  for this condition, but treatment can help to manage symptoms and prevent more health problems from developing. Treatment varies depending on your symptoms and if you want to have a baby or if you need birth control. Treatment may include: Making nutrition and lifestyle changes. Taking the progesterone hormone to start a menstrual period. Taking birth control pills to help you have regular menstrual periods. Taking medicines such as: Medicines to make you ovulate, if you want to get pregnant. Medicine to reduce extra hair growth. Having surgery in severe cases. This may involve making small holes in one or both of your ovaries. This decreases the amount of testosterone that your body makes. Follow these instructions at home: Take over-the-counter and prescription medicines only as told by your health care provider. Follow a healthy meal plan that includes lean proteins, complex carbohydrates, fresh fruits and vegetables, low-fat dairy products, healthy fats, and fiber. If you are overweight, lose weight as told by your health care provider. Your health care provider can determine how much weight loss is best for you and can help you lose weight safely. Keep all follow-up visits. This is important. Contact a health care provider if: Your symptoms do not get better with medicine. Your symptoms get worse or you develop new symptoms. Summary Polycystic ovarian syndrome (PCOS) is a common hormonal disorder among women of reproductive age. PCOS can cause problems with menstrual periods and make it hard to get and stay pregnant. If this condition is not treated, it can lead to serious health problems, such as diabetes and heart disease. There is no cure for this condition, but treatment   can help to manage symptoms and prevent more health problems from developing. This information is not intended to replace advice given to you by your health care provider. Make sure you discuss any questions you have  with your health care provider. Document Revised: 05/18/2020 Document Reviewed: 05/18/2020 Elsevier Patient Education  2024 [employer]  

## 2023-06-12 ENCOUNTER — Encounter: Payer: Self-pay | Admitting: Certified Nurse Midwife

## 2023-06-12 LAB — FSH/LH
FSH: 6.7 m[IU]/mL
LH: 12.4 m[IU]/mL

## 2023-06-12 LAB — TSH+FREE T4
Free T4: 1.2 ng/dL (ref 0.82–1.77)
TSH: 3.65 u[IU]/mL (ref 0.450–4.500)

## 2023-06-12 LAB — TESTOSTERONE: Testosterone: 29 ng/dL (ref 8–60)

## 2023-06-12 LAB — PROLACTIN: Prolactin: 13.2 ng/mL (ref 4.8–33.4)

## 2023-06-16 NOTE — Progress Notes (Unsigned)
Celso Amy, PA-C [address]  [address]  [place], [place] [address]  Main: [phone]  Fax: [phone]   Gastroenterology Consultation  Referring Provider:     Kara Dies, NP Primary Care Physician:  Kara Dies, NP Primary Gastroenterologist:  Celso Amy, PA-C / Dr. Midge Minium  Reason for Consultation:     Elevated Alk Phosphatase, Anemia        HPI:   Angel Cole is a 32 y.o. y/o female referred for consultation & management  by Kara Dies, NP.  Here to evaluate elevated alkaline phosphatase.  Labs done/6/24 showed elevated alkaline phosphatase 156.  Normal AST 17, ALT 17, total bilirubin 0.5, normal calcium 8.9.  In 2021, alk phos was mildly elevated at 130.  In 2020 alk phos was 133.  In 2019 she had normal alk phos 119.  Abdominal/pelvic CT with and without contrast 06/2021 (to evaluate hematuria) showed hepatomegaly with diffuse severe hepatic steatosis.  Normal gallbladder and pancreas.  Normal kidneys.  No kidney stones.  Mildly enlarged right inguinal lymph node, decreased in size from previous CT, nonspecific and stable/benign.  She has mild chronic microcytic anemia.  Recent labs showed hemoglobin 11.4, hematocrit 35, MCV 73.  Baseline hemoglobin is around 11 for several years.    She denies menorrhagia.  She had 1 menstrual cycle this year and has had amenorrhea.  Appointment with GYN in the near future.    GI Symptoms: She has had mild bowel irregularities with diarrhea alternating with constipation.  Chronic right side abdominal pain for several years.  CT scans have been unrevealing.  Denies alcohol use.  No family history of liver disease.  Denies rectal bleeding, hematochezia, or melena.  Still has gallbladder.  No abdominal or pelvic surgeries.  Struggles with morbid obesity.  Right side pain has been relieved after bowel movement.  No change with movement.  No previous GI evaluation.     Past Medical History:  Diagnosis Date    Obesity     Past Surgical History:  Procedure Laterality Date   THERAPEUTIC ABORTION     TONSILLECTOMY AND ADENOIDECTOMY     age 80    Prior to Admission medications   Medication Sig Start Date End Date Taking? Authorizing Provider  metFORMIN (GLUCOPHAGE-XR) 500 MG 24 hr tablet TAKE 1 TABLET BY MOUTH EVERY DAY WITH BREAKFAST 05/26/23   Kara Dies, NP  Semaglutide,0.25 or 0.5MG /DOS, 2 MG/3ML SOPN Inject 0.25 mg into the skin once a week. 05/01/23   Kara Dies, NP  sertraline (ZOLOFT) 25 MG tablet TAKE 1 TABLET (25 MG TOTAL) BY MOUTH DAILY. 05/09/23   Kara Dies, NP    Family History  Problem Relation Age of Onset   Hypertension Maternal Grandmother    Diabetes Maternal Grandfather    Hypertension Maternal Grandfather    Hyperlipidemia Maternal Grandfather      Social History   Tobacco Use   Smoking status: Never   Smokeless tobacco: Never  Vaping Use   Vaping Use: Never used  Substance Use Topics   Alcohol use: No   Drug use: No    Allergies as of 06/17/2023   (No Known Allergies)    Review of Systems:    All systems reviewed and negative except where noted in HPI.   Physical Exam:  BP 134/82   Pulse 84   Temp 98.3 F (36.8 C)   Ht 5' 4.5" (1.638 m)   Wt (!) 322 lb 9.6 oz (146.3 kg)  LMP  (LMP Unknown)   BMI 54.52 kg/m  No LMP recorded (lmp unknown). (Menstrual status: Irregular Periods). Psych:  Alert and cooperative. Normal mood and affect. General:   Alert, morbidly obese, well-developed, well-nourished, pleasant and cooperative in NAD Head:  Normocephalic and atraumatic. Eyes:  Sclera clear, no icterus.   Conjunctiva pink. Neck:  Supple; no masses or thyromegaly. Lungs:  Respirations even and unlabored.  Clear throughout to auscultation.   No wheezes, crackles, or rhonchi. No acute distress. Heart:  Regular rate and rhythm; no murmurs, clicks, rubs, or gallops. Abdomen:  Normal bowel sounds.  No bruits.  Soft, and non-distended  without masses, hepatosplenomegaly or hernias noted.  No Tenderness.  No guarding or rebound tenderness.    Neurologic:  Alert and oriented x3;  grossly normal neurologically. Psych:  Alert and cooperative. Normal mood and affect.  Imaging Studies: No results found.  Assessment and Plan:   [name] is a 32 y.o. y/o female has been referred for:  1.  Elevated alkaline phosphatase Labs: Alkaline phosphatase isoenzymes, GGT, intact PTH and calcium, hepatic panel, ANA, AMA, ASMA, iron panel, ferritin, viral hepatitis A/B/C, TSH.  RUQ Ultrasound  2.  Anemia   Labs: CBC, iron panel, ferritin, B12, folate.   If she has iron deficiency anemia, then I recommend schedule EGD and colonoscopy.  She does NOT have menorrhagia.  She has had 1 menstrual cycle this year.  She is not on iron or vitamin B12.  3.  Hepatic Steatosis -severe with hepatomegaly  Recommend a low-fat diet, regular exercise, and weight loss. Patient education handout about fatty liver disease was given and discussed from up-to-date.   RUQ Ultrasound  Discussed Rezdiffra medication - possibly try in the future.  4.  Morbid obesity: BMI 54  Follow up in 1 month.  Celso Amy, PA-C

## 2023-06-17 ENCOUNTER — Encounter: Payer: Self-pay | Admitting: Physician Assistant

## 2023-06-17 ENCOUNTER — Ambulatory Visit (INDEPENDENT_AMBULATORY_CARE_PROVIDER_SITE_OTHER): Payer: 59 | Admitting: Physician Assistant

## 2023-06-17 VITALS — BP 134/82 | HR 84 | Temp 98.3°F | Ht 64.5 in | Wt 322.6 lb

## 2023-06-17 DIAGNOSIS — D649 Anemia, unspecified: Secondary | ICD-10-CM

## 2023-06-17 DIAGNOSIS — K760 Fatty (change of) liver, not elsewhere classified: Secondary | ICD-10-CM

## 2023-06-17 DIAGNOSIS — R748 Abnormal levels of other serum enzymes: Secondary | ICD-10-CM

## 2023-06-17 NOTE — Patient Instructions (Addendum)
Ultrasound scheduled @ [facility] on  06/24/23 @ 7:45 am. . Nothing to eat/drink after midnight.  [address]  [place]

## 2023-06-18 LAB — CBC
MCV: 74 fL — ABNORMAL LOW (ref 79–97)
Platelets: 306 10*3/uL (ref 150–450)
WBC: 6.7 10*3/uL (ref 3.4–10.8)

## 2023-06-18 LAB — B12 AND FOLATE PANEL
Folate: 9.8 ng/mL (ref >3.0)
Vitamin B-12: 302 pg/mL (ref 232–1245)

## 2023-06-18 LAB — IRON,TIBC AND FERRITIN PANEL: Total Iron Binding Capacity: 348 ug/dL (ref 250–450)

## 2023-06-19 ENCOUNTER — Telehealth: Payer: Self-pay

## 2023-06-19 NOTE — Telephone Encounter (Signed)
Tried reaching patient via cell phone- mailbox full-unable to leave message.  Notify patient:  1.)  Alkaline phosphatase is still elevated.  Suspect due to fatty liver.  Continue with plan for abdominal ultrasound scheduled.  2.)  PTH and calcium are normal.  No evidence of hyperparathyroidism.  3.)  GGT liver test is normal.  4.)  TSH thyroid test is normal.  5.)  ANA, AMA, and ASMA labs are normal.  No evidence of autoimmune hepatitis or primary biliary cirrhosis.  6.)  Folate is normal.  Iron and vitamin B12 are mildly low.  I recommend patient take a general multivitamin daily which contains vitamin B12 and iron.

## 2023-06-19 NOTE — Progress Notes (Signed)
Notify patient: 1.)  Alkaline phosphatase is still elevated.  Suspect due to fatty liver.  Continue with plan for abdominal ultrasound scheduled. 2.)  PTH and calcium are normal.  No evidence of hyperparathyroidism. 3.)  GGT liver test is normal. 4.)  TSH thyroid test is normal. 5.)  ANA, AMA, and ASMA labs are normal.  No evidence of autoimmune hepatitis or primary biliary cirrhosis. 6.)  Folate is normal.  Iron and vitamin B12 are mildly low.  I recommend patient take a general multivitamin daily which contains vitamin B12 and iron.

## 2023-06-22 LAB — TSH: TSH: 4.06 u[IU]/mL (ref 0.450–4.500)

## 2023-06-22 LAB — CBC
Hematocrit: 37.5 % (ref 34.0–46.6)
Hemoglobin: 11.6 g/dL (ref 11.1–15.9)
MCH: 22.9 pg — ABNORMAL LOW (ref 26.6–33.0)
MCHC: 30.9 g/dL — ABNORMAL LOW (ref 31.5–35.7)
RBC: 5.07 x10E6/uL (ref 3.77–5.28)
RDW: 15.8 % — ABNORMAL HIGH (ref 11.7–15.4)

## 2023-06-22 LAB — ALKALINE PHOSPHATASE, ISOENZYMES
Alkaline Phosphatase: 160 IU/L — ABNORMAL HIGH (ref 44–121)
BONE FRACTION: 23 % (ref 14–68)
INTESTINAL FRAC.: 4 % (ref 0–18)
LIVER FRACTION: 73 % (ref 18–85)

## 2023-06-22 LAB — MITOCHONDRIAL/SMOOTH MUSCLE AB PNL
Mitochondrial Ab: <20 Units (ref 0.0–20.0)
Smooth Muscle Ab: 6 Units (ref 0–19)

## 2023-06-22 LAB — IRON,TIBC AND FERRITIN PANEL
Ferritin: 85 ng/mL (ref 15–150)
Iron Saturation: 12 % — ABNORMAL LOW (ref 15–55)
Iron: 43 ug/dL (ref 27–159)
UIBC: 305 ug/dL (ref 131–425)

## 2023-06-22 LAB — PTH, INTACT AND CALCIUM
Calcium: 9 mg/dL (ref 8.7–10.2)
PTH: 38 pg/mL (ref 15–65)

## 2023-06-22 LAB — GAMMA GT: GGT: 27 IU/L (ref 0–60)

## 2023-06-22 LAB — ANA: Anti Nuclear Antibody (ANA): NEGATIVE

## 2023-06-23 NOTE — Telephone Encounter (Signed)
LVM to call office regarding lab results

## 2023-06-24 ENCOUNTER — Ambulatory Visit: Admission: RE | Admit: 2023-06-24 | Payer: 59 | Source: Ambulatory Visit

## 2023-07-15 ENCOUNTER — Other Ambulatory Visit: Payer: Self-pay | Admitting: Certified Nurse Midwife

## 2023-07-15 ENCOUNTER — Ambulatory Visit (INDEPENDENT_AMBULATORY_CARE_PROVIDER_SITE_OTHER): Payer: 59

## 2023-07-15 DIAGNOSIS — N912 Amenorrhea, unspecified: Secondary | ICD-10-CM | POA: Diagnosis not present

## 2023-07-15 DIAGNOSIS — Z7689 Persons encountering health services in other specified circumstances: Secondary | ICD-10-CM

## 2023-07-21 ENCOUNTER — Other Ambulatory Visit: Payer: Self-pay | Admitting: Nurse Practitioner

## 2023-07-22 ENCOUNTER — Encounter: Payer: Self-pay | Admitting: Certified Nurse Midwife

## 2023-07-29 ENCOUNTER — Other Ambulatory Visit: Payer: 59

## 2023-08-01 ENCOUNTER — Ambulatory Visit: Payer: 59 | Admitting: Nurse Practitioner

## 2023-08-01 ENCOUNTER — Telehealth: Payer: Self-pay

## 2023-08-01 NOTE — Telephone Encounter (Signed)
Pt called back stating she had car trouble and she will call back next week to reschedule

## 2023-08-01 NOTE — Telephone Encounter (Signed)
Left message for Patient to call the office back to find out why she missed her appointment this morning and she if she would like to reschedule.

## 2023-08-11 ENCOUNTER — Other Ambulatory Visit (INDEPENDENT_AMBULATORY_CARE_PROVIDER_SITE_OTHER): Payer: 59

## 2023-08-11 DIAGNOSIS — R748 Abnormal levels of other serum enzymes: Secondary | ICD-10-CM

## 2023-08-11 DIAGNOSIS — Z7984 Long term (current) use of oral hypoglycemic drugs: Secondary | ICD-10-CM

## 2023-08-11 DIAGNOSIS — E119 Type 2 diabetes mellitus without complications: Secondary | ICD-10-CM

## 2023-08-11 NOTE — Progress Notes (Unsigned)
PCP:  Angel Dies, NP   No chief complaint on file.   HPI:      [name] is a 32 y.o. G1P1001 whose LMP was No LMP recorded. (Menstrual status: Irregular Periods)., presents today for her annual examination.  Her menses are {norm/abn:715}, lasting {number: 22536} days.  Dysmenorrhea {dysmen:716}. She {does:18564} have intermenstrual bleeding. Amenorrhea since 1/20; used t have monthhly menses 2021; normal labs for PCOS  6/24; GYN u/s with EM=11??? Possible PCOS on u/s. Had menses after staring ozempic  Sex activity: {sex active: 315163}. Interested in IUD Last Pap: 01/16/18 Results were: no abnormalities  Hx of STDs: {STD hx:14358}  Last mammogram: {date:304500300}  Results were: {norm/abn:13465} There is no FH of breast cancer. There is no FH of ovarian cancer. The patient {does:18564} do self-breast exams.  Tobacco use: {tob:20664} Alcohol use: {Alcohol:11675} No drug use.  Exercise: {exercise:31265}  She {does:18564} get adequate calcium and Vitamin D in her diet.  Patient Active Problem List   Diagnosis Date Noted   High triglycerides 05/18/2023   Amenorrhea 05/18/2023   Non-alcoholic fatty liver disease 05/18/2023   Elevated alkaline phosphatase level 05/18/2023   Anxiety and depression 04/28/2023   Anemia 04/28/2023   Morbid obesity (HCC) 04/28/2023   Elevated BP without diagnosis of hypertension 04/28/2023   Diabetes mellitus treated with injections of non-insulin medication (HCC) 04/28/2023   OTITIS MEDIA, LEFT 07/12/2009    Past Surgical History:  Procedure Laterality Date   THERAPEUTIC ABORTION     TONSILLECTOMY AND ADENOIDECTOMY     age 83    Family History  Problem Relation Age of Onset   Hypertension Maternal Grandmother    Diabetes Maternal Grandfather    Hypertension Maternal Grandfather    Hyperlipidemia Maternal Grandfather     Social History   Socioeconomic History   Marital status: Single    Spouse name: Not on file    Number of children: 1   Years of education: 14   Highest education level: Not on file  Occupational History   Occupation: Government social research officer    Employer: [employer]  Tobacco Use   Smoking status: Never   Smokeless tobacco: Never  Vaping Use   Vaping status: Never Used  Substance and Sexual Activity   Alcohol use: No   Drug use: No   Sexual activity: Not Currently    Birth control/protection: None  Other Topics Concern   Not on file  Social History Narrative   Not on file   Social Determinants of Health   Financial Resource Strain: Not on file  Food Insecurity: Not on file  Transportation Needs: Not on file  Physical Activity: Not on file  Stress: Not on file  Social Connections: Not on file  Intimate Partner Violence: Not on file     Current Outpatient Medications:    metFORMIN (GLUCOPHAGE-XR) 500 MG 24 hr tablet, TAKE 1 TABLET BY MOUTH EVERY DAY WITH BREAKFAST, Disp: 90 tablet, Rfl: 1   Semaglutide,0.25 or 0.5MG /DOS, (OZEMPIC, 0.25 OR 0.5 MG/DOSE,) 2 MG/3ML SOPN, INJECT 0.25MG  INTO THE SKIN ONE TIME PER WEEK, Disp: 3 mL, Rfl: 2   sertraline (ZOLOFT) 25 MG tablet, TAKE 1 TABLET (25 MG TOTAL) BY MOUTH DAILY., Disp: 90 tablet, Rfl: 2     ROS:  Review of Systems BREAST: No symptoms   Objective: There were no vitals taken for this visit.   OBGyn Exam  Results: No results found for this or any previous visit (from the past 24 hour(s)).  Assessment/Plan: No diagnosis found.  No orders of the defined types were placed in this encounter.            GYN counsel {counseling: 16159}     F/U  No follow-ups on file.  Angel Pagliarulo B. Pascale Maves, PA-C 08/11/2023 9:33 PM

## 2023-08-12 ENCOUNTER — Other Ambulatory Visit (HOSPITAL_COMMUNITY)
Admission: RE | Admit: 2023-08-12 | Discharge: 2023-08-12 | Disposition: A | Discharge status: Home / Self Care | Payer: 59 | Source: Ambulatory Visit | Attending: Obstetrics and Gynecology | Admitting: Obstetrics and Gynecology

## 2023-08-12 ENCOUNTER — Telehealth: Payer: Self-pay

## 2023-08-12 ENCOUNTER — Ambulatory Visit (INDEPENDENT_AMBULATORY_CARE_PROVIDER_SITE_OTHER): Payer: 59 | Admitting: Obstetrics and Gynecology

## 2023-08-12 ENCOUNTER — Encounter: Payer: Self-pay | Admitting: Obstetrics and Gynecology

## 2023-08-12 VITALS — BP 144/96 | HR 82 | Ht 64.5 in | Wt 321.6 lb

## 2023-08-12 DIAGNOSIS — Z01419 Encounter for gynecological examination (general) (routine) without abnormal findings: Secondary | ICD-10-CM | POA: Diagnosis not present

## 2023-08-12 DIAGNOSIS — Z124 Encounter for screening for malignant neoplasm of cervix: Secondary | ICD-10-CM

## 2023-08-12 DIAGNOSIS — N912 Amenorrhea, unspecified: Secondary | ICD-10-CM

## 2023-08-12 DIAGNOSIS — Z1151 Encounter for screening for human papillomavirus (HPV): Secondary | ICD-10-CM | POA: Diagnosis present

## 2023-08-12 DIAGNOSIS — Z30014 Encounter for initial prescription of intrauterine contraceptive device: Secondary | ICD-10-CM

## 2023-08-12 LAB — HEPATIC FUNCTION PANEL
ALT: 17 U/L (ref 0–35)
AST: 18 U/L (ref 0–37)
Albumin: 4.1 g/dL (ref 3.5–5.2)
Alkaline Phosphatase: 147 U/L — ABNORMAL HIGH (ref 39–117)
Bilirubin, Direct: 0.1 mg/dL (ref 0.0–0.3)
Total Bilirubin: 0.5 mg/dL (ref 0.2–1.2)
Total Protein: 8.3 g/dL (ref 6.0–8.3)

## 2023-08-12 LAB — HEMOGLOBIN A1C: Hgb A1c MFr Bld: 5.9 % (ref 4.6–6.5)

## 2023-08-12 MED ORDER — MEDROXYPROGESTERONE ACETATE 10 MG PO TABS
10.0000 mg | ORAL_TABLET | Freq: Every day | ORAL | 0 refills | Status: DC
Start: 2023-08-12 — End: 2023-08-29

## 2023-08-12 NOTE — Telephone Encounter (Signed)
Pt calling to see when she should start her medication.  [phone]  Adv pt okay to start it today - provera tabs one daily for 7 days.

## 2023-08-12 NOTE — Patient Instructions (Signed)
I value your feedback and you entrusting us with your care. If you get a [facility] patient survey, I would appreciate you taking the time to let us know about your experience today. Thank you! ? ? ?

## 2023-08-14 ENCOUNTER — Ambulatory Visit (INDEPENDENT_AMBULATORY_CARE_PROVIDER_SITE_OTHER): Payer: 59 | Admitting: Nurse Practitioner

## 2023-08-14 ENCOUNTER — Encounter: Payer: Self-pay | Admitting: Nurse Practitioner

## 2023-08-14 VITALS — BP 134/82 | HR 96 | Temp 97.9°F | Ht 64.5 in | Wt 318.6 lb

## 2023-08-14 DIAGNOSIS — R030 Elevated blood-pressure reading, without diagnosis of hypertension: Secondary | ICD-10-CM | POA: Diagnosis not present

## 2023-08-14 DIAGNOSIS — F419 Anxiety disorder, unspecified: Secondary | ICD-10-CM

## 2023-08-14 DIAGNOSIS — Z79899 Other long term (current) drug therapy: Secondary | ICD-10-CM

## 2023-08-14 DIAGNOSIS — R748 Abnormal levels of other serum enzymes: Secondary | ICD-10-CM

## 2023-08-14 DIAGNOSIS — Z7985 Long-term (current) use of injectable non-insulin antidiabetic drugs: Secondary | ICD-10-CM | POA: Diagnosis not present

## 2023-08-14 DIAGNOSIS — E119 Type 2 diabetes mellitus without complications: Secondary | ICD-10-CM

## 2023-08-14 DIAGNOSIS — F32A Depression, unspecified: Secondary | ICD-10-CM

## 2023-08-14 LAB — CYTOLOGY - PAP
Comment: NEGATIVE
Diagnosis: UNDETERMINED — AB
High risk HPV: POSITIVE — AB

## 2023-08-14 MED ORDER — OZEMPIC (0.25 OR 0.5 MG/DOSE) 2 MG/3ML ~~LOC~~ SOPN: 0.5000 mg | PEN_INJECTOR | SUBCUTANEOUS | 2 refills | Status: DC

## 2023-08-14 NOTE — Progress Notes (Signed)
Established Patient Office Visit  Subjective:  Patient ID: Angel Cole, female    DOB: 1991/03/23  Age: 32 y.o. MRN: [id number]  CC:  Chief Complaint  Patient presents with   Medical Management of Chronic Issues    HPI  Angel Cole presents for follow-up on chronic conditions.  She has a history of elevated blood pressure, diabetes, anxiety and depression, NAFLD, anemia and morbid obesity.  She has been experiencing panic attack during the night off and on started July and the last panic attack was on 08/07/23.  She is taking 25 mg daily.  She is trying to eat healthy and have lost 13 pounds since her last visit.  Denies chest pain shortness of breath or headache at present.  HPI   Past Medical History:  Diagnosis Date   Obesity     Past Surgical History:  Procedure Laterality Date   THERAPEUTIC ABORTION     TONSILLECTOMY AND ADENOIDECTOMY     age 82    Family History  Problem Relation Age of Onset   Hypertension Maternal Grandmother    Diabetes Maternal Grandfather    Hypertension Maternal Grandfather    Hyperlipidemia Maternal Grandfather     Social History   Socioeconomic History   Marital status: Single    Spouse name: Not on file   Number of children: 1   Years of education: 14   Highest education level: Associate degree: academic program  Occupational History   Occupation: Chartered certified accountant: [employer]  Tobacco Use   Smoking status: Never   Smokeless tobacco: Never  Vaping Use   Vaping status: Never Used  Substance and Sexual Activity   Alcohol use: No   Drug use: No   Sexual activity: Not Currently    Birth control/protection: None  Other Topics Concern   Not on file  Social History Narrative   Not on file   Social Determinants of Health   Financial Resource Strain: Medium Risk (08/13/2023)   Overall Financial Resource Strain (CARDIA)    Difficulty of Paying Living Expenses: Somewhat hard  Food Insecurity: No  Food Insecurity (08/13/2023)   Hunger Vital Sign    Worried About Running Out of Food in the Last Year: Never true    Ran Out of Food in the Last Year: Never true  Transportation Needs: Unmet Transportation Needs (08/13/2023)   PRAPARE - Administrator, Civil Service (Medical): Yes    Lack of Transportation (Non-Medical): No  Physical Activity: Insufficiently Active (08/13/2023)   Exercise Vital Sign    Days of Exercise per Week: 2 days    Minutes of Exercise per Session: 30 min  Stress: No Stress Concern Present (08/13/2023)   [employer] of Occupational Health - Occupational Stress Questionnaire    Feeling of Stress : Only a little  Social Connections: Socially Isolated (08/13/2023)   Social Connection and Isolation Panel [NHANES]    Frequency of Communication with Friends and Family: More than three times a week    Frequency of Social Gatherings with Friends and Family: Once a week    Attends Religious Services: Never    Database administrator or Organizations: No    Attends Engineer, structural: Not on file    Marital Status: Never married  Intimate Partner Violence: Not on file     Outpatient Medications Prior to Visit  Medication Sig Dispense Refill   medroxyPROGESTERone (PROVERA) 10 MG tablet Take 1 tablet (10 mg  total) by mouth daily for 7 days. 7 tablet 0   metFORMIN (GLUCOPHAGE-XR) 500 MG 24 hr tablet TAKE 1 TABLET BY MOUTH EVERY DAY WITH BREAKFAST 90 tablet 1   sertraline (ZOLOFT) 25 MG tablet TAKE 1 TABLET (25 MG TOTAL) BY MOUTH DAILY. 90 tablet 2   Semaglutide,0.25 or 0.5MG /DOS, (OZEMPIC, 0.25 OR 0.5 MG/DOSE,) 2 MG/3ML SOPN INJECT 0.25MG  INTO THE SKIN ONE TIME PER WEEK 3 mL 2   No facility-administered medications prior to visit.    No Known Allergies  ROS Review of Systems Negative unless indicated in HPI.    Objective:    Physical Exam Constitutional:      Appearance: Normal appearance.  HENT:     Mouth/Throat:     Mouth: Mucous  membranes are moist.  Eyes:     Conjunctiva/sclera: Conjunctivae normal.     Pupils: Pupils are equal, round, and reactive to light.  Cardiovascular:     Rate and Rhythm: Normal rate and regular rhythm.     Pulses: Normal pulses.     Heart sounds: Normal heart sounds.  Pulmonary:     Effort: Pulmonary effort is normal.     Breath sounds: Normal breath sounds.  Abdominal:     General: Bowel sounds are normal.     Palpations: Abdomen is soft.  Musculoskeletal:     Cervical back: Normal range of motion. No tenderness.  Skin:    General: Skin is warm.     Findings: No bruising.  Neurological:     General: No focal deficit present.     Mental Status: She is alert and oriented to person, place, and time. Mental status is at baseline.  Psychiatric:        Mood and Affect: Mood normal.        Behavior: Behavior normal.        Thought Content: Thought content normal.        Judgment: Judgment normal.     BP 134/82   Pulse 96   Temp 97.9 F (36.6 C) (Oral)   Ht 5' 4.5" (1.638 m)   Wt (!) 318 lb 9.6 oz (144.5 kg)   LMP 06/01/2023   SpO2 97%   BMI 53.84 kg/m  Wt Readings from Last 3 Encounters:  08/14/23 (!) 318 lb 9.6 oz (144.5 kg)  08/12/23 (!) 321 lb 9.6 oz (145.9 kg)  06/17/23 (!) 322 lb 9.6 oz (146.3 kg)     Health Maintenance  Topic Date Due   FOOT EXAM  Never done   OPHTHALMOLOGY EXAM  Never done   HIV Screening  Never done   Hepatitis C Screening  Never done   COVID-19 Vaccine (3 - Pfizer risk series) 04/27/2020   DTaP/Tdap/Td (8 - Td or Tdap) 11/12/2022   INFLUENZA VACCINE  03/22/2024 (Originally 07/24/2023)   HEMOGLOBIN A1C  02/11/2024   Diabetic kidney evaluation - eGFR measurement  04/27/2024   Diabetic kidney evaluation - Urine ACR  08/13/2024   PAP SMEAR-Modifier  08/11/2026   HPV VACCINES  Aged Out    There are no preventive care reminders to display for this patient.  Lab Results  Component Value Date   TSH 4.060 06/17/2023   Lab Results   Component Value Date   WBC 6.7 06/17/2023   HGB 11.6 06/17/2023   HCT 37.5 06/17/2023   MCV 74 (L) 06/17/2023   PLT 306 06/17/2023   Lab Results  Component Value Date   NA 135 04/28/2023   K 4.1 04/28/2023  CO2 25 04/28/2023   GLUCOSE 102 (H) 04/28/2023   BUN 10 04/28/2023   CREATININE 0.74 04/28/2023   BILITOT 0.5 08/11/2023   ALKPHOS 147 (H) 08/11/2023   AST 18 08/11/2023   ALT 17 08/11/2023   PROT 8.3 08/11/2023   ALBUMIN 4.1 08/11/2023   CALCIUM 9.0 06/17/2023   ANIONGAP 13 02/17/2023   GFR 107.80 04/28/2023   Lab Results  Component Value Date   CHOL 159 04/28/2023   Lab Results  Component Value Date   HDL 39.80 04/28/2023   Lab Results  Component Value Date   LDLCALC 85 04/28/2023   Lab Results  Component Value Date   TRIG 173.0 (H) 04/28/2023   Lab Results  Component Value Date   CHOLHDL 4 04/28/2023   Lab Results  Component Value Date   HGBA1C 5.9 08/11/2023      Assessment & Plan:  Diabetes mellitus treated with injections of non-insulin medication [facility]) Assessment & Plan: Lab Results  Component Value Date   HGBA1C 5.9 08/11/2023  Advised patient to continue working on diet and exercise. Will increase to Ozempic 0.5 Mg weekly.   Orders: -     Microalbumin / creatinine urine ratio  Anxiety and depression Assessment & Plan: Her PHQ-9 score is 4 and GAD-7 score is 5. Advised patient to increase to Zoloft 50 Mg daily    Elevated BP without diagnosis of hypertension Assessment & Plan: Patient BP  Vitals:   08/14/23 1542  BP: 134/82    in the office. Advised pt to follow a low sodium and heart healthy diet. If blood pressure remains elevated will start her on blood pressure medication   Elevated alkaline phosphatase level Assessment & Plan: Lab Results  Component Value Date   ALT 17 08/11/2023   AST 18 08/11/2023   GGT 27 06/17/2023   ALKPHOS 147 (H) 08/11/2023   BILITOT 0.5 08/11/2023  She is followed by  GI.     Other orders -     Ozempic (0.25 or 0.5 MG/DOSE); Inject 0.5 mg into the skin once a week.  Dispense: 3 mL; Refill: 2    Follow-up: Return in about 6 months (around 02/14/2024) for chronic management.   Kara Dies, NP

## 2023-08-14 NOTE — Patient Instructions (Signed)
Rx sent to pharmacy   

## 2023-08-15 LAB — MICROALBUMIN / CREATININE URINE RATIO
Creatinine,U: 324.5 mg/dL
Microalb Creat Ratio: 1.5 mg/g (ref 0.0–30.0)
Microalb, Ur: 4.7 mg/dL — ABNORMAL HIGH (ref 0.0–1.9)

## 2023-08-21 ENCOUNTER — Encounter: Payer: Self-pay | Admitting: Obstetrics and Gynecology

## 2023-08-21 NOTE — Progress Notes (Signed)
Pls notify pt of results and get her colpo scheduled. Thx.

## 2023-08-24 NOTE — Assessment & Plan Note (Signed)
Patient BP  Vitals:   08/14/23 1542  BP: 134/82    in the office. Advised pt to follow a low sodium and heart healthy diet. If blood pressure remains elevated will start her on blood pressure medication

## 2023-08-24 NOTE — Assessment & Plan Note (Signed)
Lab Results  Component Value Date   HGBA1C 5.9 08/11/2023  Advised patient to continue working on diet and exercise. Will increase to Ozempic 0.5 Mg weekly.

## 2023-08-24 NOTE — Assessment & Plan Note (Signed)
Her PHQ-9 score is 4 and GAD-7 score is 5. Advised patient to increase to Zoloft 50 Mg daily

## 2023-08-24 NOTE — Assessment & Plan Note (Addendum)
Lab Results  Component Value Date   ALT 17 08/11/2023   AST 18 08/11/2023   GGT 27 06/17/2023   ALKPHOS 147 (H) 08/11/2023   BILITOT 0.5 08/11/2023  She is followed by GI.

## 2023-08-26 ENCOUNTER — Encounter: Payer: Self-pay | Admitting: Nurse Practitioner

## 2023-08-26 NOTE — Telephone Encounter (Signed)
Pls call pt to schedule IUD appt. Thx.

## 2023-08-28 ENCOUNTER — Ambulatory Visit: Payer: 59 | Admitting: Obstetrics and Gynecology

## 2023-08-28 NOTE — Telephone Encounter (Signed)
Patient needs to be evaluated. Please call to schedule appointment.

## 2023-08-28 NOTE — Telephone Encounter (Signed)
Scheduled

## 2023-08-28 NOTE — Progress Notes (Signed)
   Chief Complaint  Patient presents with   Contraception    IUD insertion     IUD PROCEDURE NOTE:  Angel Cole is a 32 y.o. G1P1001 here for Mirena  IUD insertion for menometrorrhagia and PCOS.  Her menses are infrequent due to PCOS,  lasting 10 days with heavy flow and clots, has to wear diapers.  Dysmenorrhea improved with tylenol. She does not have intermenstrual bleeding. LMP 6/24 after starting ozempic; PMP 1 yr before.  Amenorrhea past few yrs; used to have monthhly menses per 2019-2021 notes (pt was ~60# less than now); normal labs for PCOS  6/24, has hirsutism; 7/24 GYN u/s with EM=11.2 mm; possible PCOS on u/s. Discussed IUD with Doreene Burke at 6/24 appt and pt would like to proceed. Did OCPs in past and didn't like how she felt; depo in past too  BP 138/83   Pulse 81   Ht 5' 4.5" (1.638 m)   Wt (!) 319 lb (144.7 kg)   LMP 08/25/2023 (Exact Date)   BMI 53.91 kg/m   IUD Insertion Procedure Note Patient identified, informed consent performed, consent signed.   Discussed risks of irregular bleeding, cramping, infection, malpositioning or misplacement of the IUD outside the uterus which may require further procedure such as laparoscopy, risk of failure <1%. Time out was performed.    Speculum placed in the vagina.  Cervix visualized.  Cleaned with Betadine x 2.  Grasped anteriorly with a single tooth tenaculum.  Uterus sounded to 9.0 cm.   IUD placed per manufacturer's recommendations.  Strings trimmed to 3 cm. Tenaculum was removed, good hemostasis noted.  Patient tolerated procedure well.   ASSESSMENT:  Encounter for insertion of intrauterine contraceptive device (IUD) - Plan: levonorgestrel (MIRENA) 20 MCG/DAY IUD 1 each  PCOS (polycystic ovarian syndrome)   Meds ordered this encounter  Medications   levonorgestrel (MIRENA) 20 MCG/DAY IUD 1 each     Plan:  Patient was given post-procedure instructions.  She was advised to have backup contraception for one  week.   Call if you are having increasing pain, cramps or bleeding or if you have a fever greater than 100.4 degrees F., shaking chills, nausea or vomiting. Patient was also asked to check IUD strings periodically and follow up in 4 weeks for IUD check.  Return in about 4 weeks (around 09/26/2023) for IUD f/u.  Merryn Thaker B. Joclynn Lumb, PA-C 08/29/2023 10:21 AM

## 2023-08-29 ENCOUNTER — Ambulatory Visit (INDEPENDENT_AMBULATORY_CARE_PROVIDER_SITE_OTHER): Payer: 59 | Admitting: Obstetrics and Gynecology

## 2023-08-29 ENCOUNTER — Encounter: Payer: Self-pay | Admitting: Obstetrics and Gynecology

## 2023-08-29 VITALS — BP 138/83 | HR 81 | Ht 64.5 in | Wt 319.0 lb

## 2023-08-29 DIAGNOSIS — Z30430 Encounter for insertion of intrauterine contraceptive device: Secondary | ICD-10-CM | POA: Diagnosis not present

## 2023-08-29 DIAGNOSIS — E282 Polycystic ovarian syndrome: Secondary | ICD-10-CM

## 2023-08-29 MED ORDER — LEVONORGESTREL 20 MCG/DAY IU IUD
1.0000 | INTRAUTERINE_SYSTEM | Freq: Once | INTRAUTERINE | Status: AC
Start: 2023-08-29 — End: 2023-08-29
  Administered 2023-08-29: 1 via INTRAUTERINE

## 2023-08-29 NOTE — Patient Instructions (Signed)
I value your feedback and you entrusting us with your care. If you get a [facility] patient survey, I would appreciate you taking the time to let us know about your experience today. Thank you!  Instructions after IUD insertion  Most women experience no significant problems after insertion of an IUD, however minor cramping and spotting for a few days is common. Cramps may be treated with ibuprofen 800mg every 8 hours or Tylenol 650 mg every 4 hours. Contact [facility] immediately if you experience any of the following symptoms during the next week: temperature >99.6 degrees, worsening pelvic pain, abdominal pain, fainting, unusually heavy vaginal bleeding, foul vaginal discharge, or if you think you have expelled the IUD. Nothing inserted in the vagina for 48 hours. You will be scheduled for a follow up visit in approximately four weeks.    

## 2023-09-01 ENCOUNTER — Ambulatory Visit: Payer: 59 | Admitting: Obstetrics & Gynecology

## 2023-09-16 ENCOUNTER — Other Ambulatory Visit (HOSPITAL_COMMUNITY)
Admission: RE | Admit: 2023-09-16 | Discharge: 2023-09-16 | Disposition: A | Discharge status: Home / Self Care | Payer: 59 | Source: Ambulatory Visit | Attending: Obstetrics & Gynecology | Admitting: Obstetrics & Gynecology

## 2023-09-16 ENCOUNTER — Encounter: Payer: Self-pay | Admitting: Obstetrics & Gynecology

## 2023-09-16 ENCOUNTER — Ambulatory Visit (INDEPENDENT_AMBULATORY_CARE_PROVIDER_SITE_OTHER): Payer: 59 | Admitting: Obstetrics & Gynecology

## 2023-09-16 ENCOUNTER — Encounter: Payer: Self-pay | Admitting: Nurse Practitioner

## 2023-09-16 VITALS — BP 131/83 | HR 82 | Ht 64.5 in | Wt 319.0 lb

## 2023-09-16 DIAGNOSIS — N809 Endometriosis, unspecified: Secondary | ICD-10-CM | POA: Diagnosis not present

## 2023-09-16 DIAGNOSIS — R87610 Atypical squamous cells of undetermined significance on cytologic smear of cervix (ASC-US): Secondary | ICD-10-CM | POA: Insufficient documentation

## 2023-09-16 DIAGNOSIS — E119 Type 2 diabetes mellitus without complications: Secondary | ICD-10-CM

## 2023-09-16 DIAGNOSIS — R87810 Cervical high risk human papillomavirus (HPV) DNA test positive: Secondary | ICD-10-CM

## 2023-09-16 NOTE — Progress Notes (Signed)
GYNECOLOGY PROGRESS NOTE  Subjective:    Patient ID: Angel Cole, female    DOB: [birth year], 32 y.o.   MRN: [id number]  HPI  Patient is a 32 y.o. G1P1001 here for a colpo due to ASCUS + HR HPV pap this month. She reports that she had Gardasil in the past. She denies a h/o abnormal paps in the past.  The following portions of the patient's history were reviewed and updated as appropriate: allergies, current medications, past family history, past medical history, past social history, past surgical history, and problem list.  Review of Systems Pertinent items are noted in HPI.   Objective:   Blood pressure 131/83, pulse 82, height 5' 4.5" (1.638 m), weight (!) 319 lb (144.7 kg), last menstrual period 08/25/2023. Body mass index is 53.91 kg/m.  Well nourished, well hydrated emale, no apparent distress She is ambulating and conversing normally.  Consent signed, time out done Cervix prepped with acetic acid. Transformation zone seen in its entirety. Colpo adequate. Mirena strings seen (about 2 cm long) Colpo findings all normal. ECC obtained. She tolerated the procedure well.     Assessment:   1. ASCUS with positive high risk HPV cervical      Plan:   1. ASCUS with positive high risk HPV cervical - await pathology report. We discussed that if ECC is abnormal, then she may need a LEEP.

## 2023-09-17 LAB — SURGICAL PATHOLOGY

## 2023-09-17 MED ORDER — SEMAGLUTIDE (1 MG/DOSE) 4 MG/3ML ~~LOC~~ SOPN
1.0000 mg | PEN_INJECTOR | SUBCUTANEOUS | 2 refills | Status: DC
Start: 2023-09-17 — End: 2023-12-19

## 2023-09-23 ENCOUNTER — Encounter: Payer: Self-pay | Admitting: Obstetrics & Gynecology

## 2023-09-24 NOTE — Progress Notes (Unsigned)
   No chief complaint on file.    History of Present Illness:  Angel Cole is a 32 y.o. that had a Mirena IUD placed approximately 1 month ago for menorrhagia/PCOS. Since that time, she denies dyspareunia, pelvic pain, non-menstrual bleeding, vaginal d/c, heavy bleeding.  Neg colpo bx for ASCUS/POS HPV DNA 9/24; repeat pap due in 1 yr   Review of Systems   Physical Exam:  LMP 08/25/2023 (Exact Date)  There is no height or weight on file to calculate BMI.  Pelvic exam:  Two IUD strings {DESC; PRESENT/ABSENT:17923} seen coming from the cervical os. EGBUS, vaginal vault and cervix: within normal limits   Assessment:   No diagnosis found.  IUD strings present in proper location; pt doing well  Plan: F/u if any signs of infection or can no longer feel the strings.   Daci Stubbe B. Jaianna Nicoll, PA-C 09/24/2023 3:22 PM

## 2023-09-25 ENCOUNTER — Encounter: Payer: Self-pay | Admitting: Obstetrics and Gynecology

## 2023-09-25 ENCOUNTER — Ambulatory Visit: Payer: 59 | Admitting: Obstetrics and Gynecology

## 2023-09-25 VITALS — BP 130/81 | HR 84 | Ht 64.5 in | Wt 320.0 lb

## 2023-09-25 DIAGNOSIS — N921 Excessive and frequent menstruation with irregular cycle: Secondary | ICD-10-CM | POA: Diagnosis not present

## 2023-09-25 DIAGNOSIS — Z975 Presence of (intrauterine) contraceptive device: Secondary | ICD-10-CM

## 2023-09-25 DIAGNOSIS — Z30431 Encounter for routine checking of intrauterine contraceptive device: Secondary | ICD-10-CM

## 2023-09-25 NOTE — Patient Instructions (Signed)
I value your feedback and you entrusting us with your care. If you get a [facility] patient survey, I would appreciate you taking the time to let us know about your experience today. Thank you! ? ? ?

## 2023-12-12 ENCOUNTER — Other Ambulatory Visit: Payer: Self-pay | Admitting: Nurse Practitioner

## 2023-12-19 ENCOUNTER — Other Ambulatory Visit: Payer: Self-pay | Admitting: Nurse Practitioner

## 2023-12-19 DIAGNOSIS — Z7985 Long-term (current) use of injectable non-insulin antidiabetic drugs: Secondary | ICD-10-CM

## 2024-01-16 ENCOUNTER — Other Ambulatory Visit: Payer: Self-pay | Admitting: Nurse Practitioner

## 2024-01-16 ENCOUNTER — Encounter: Payer: Self-pay | Admitting: Nurse Practitioner

## 2024-01-16 DIAGNOSIS — Z7985 Long-term (current) use of injectable non-insulin antidiabetic drugs: Secondary | ICD-10-CM

## 2024-05-22 ENCOUNTER — Other Ambulatory Visit: Payer: Self-pay | Admitting: Nurse Practitioner

## 2024-05-22 DIAGNOSIS — Z7985 Long-term (current) use of injectable non-insulin antidiabetic drugs: Secondary | ICD-10-CM

## 2024-05-26 ENCOUNTER — Other Ambulatory Visit: Payer: Self-pay | Admitting: Nurse Practitioner

## 2024-05-26 DIAGNOSIS — E781 Pure hyperglyceridemia: Secondary | ICD-10-CM

## 2024-05-26 DIAGNOSIS — E119 Type 2 diabetes mellitus without complications: Secondary | ICD-10-CM

## 2024-05-26 NOTE — Telephone Encounter (Signed)
 Please call pt to schedule appointment and fasting lab 2 days before OV.

## 2024-05-27 ENCOUNTER — Telehealth: Payer: Self-pay

## 2024-05-27 NOTE — Telephone Encounter (Signed)
 Copied from CRM [id number]. Topic: General - Other >> May 27, 2024  4:03 PM Angel Cole wrote: Reason for CRM: Patient is calling to see if she can get a 3 month supply of the Semaglutide , 1 MG/DOSE, (OZEMPIC , 1 MG/DOSE,) 4 MG/3ML SOPN   And also can she go up in the dosage

## 2024-05-28 ENCOUNTER — Other Ambulatory Visit: Payer: Self-pay | Admitting: Nurse Practitioner

## 2024-05-28 DIAGNOSIS — E119 Type 2 diabetes mellitus without complications: Secondary | ICD-10-CM

## 2024-05-28 MED ORDER — OZEMPIC (1 MG/DOSE) 4 MG/3ML ~~LOC~~ SOPN
1.0000 mg | PEN_INJECTOR | SUBCUTANEOUS | 3 refills | Status: AC
Start: 2024-05-28 — End: ?

## 2024-06-03 ENCOUNTER — Other Ambulatory Visit: Payer: Self-pay | Admitting: Nurse Practitioner

## 2024-06-03 ENCOUNTER — Encounter: Payer: Self-pay | Admitting: Nurse Practitioner

## 2024-06-03 NOTE — Telephone Encounter (Signed)
 Left message to call the office back to schedule an appointment to increase the ozempic  and for her follow up.

## 2024-06-03 NOTE — Telephone Encounter (Signed)
 Pt is past due for her follow up and labs. Please schedule appointment before increasing the dose of ozempic .

## 2024-06-03 NOTE — Telephone Encounter (Signed)
Left message to call the office back to schedule an appointment.

## 2024-06-03 NOTE — Telephone Encounter (Signed)
 Please see previous message

## 2024-06-03 NOTE — Telephone Encounter (Unsigned)
 Copied from CRM [id number]. Topic: Clinical - Medication Refill >> Jun 03, 2024  4:37 PM Shereese L wrote: Medication: Semaglutide , 1 MG/DOSE, (OZEMPIC , 1 MG/DOSE,) 4 MG/3ML SOPN  Has the patient contacted their pharmacy? Yes (Agent: If no, request that the patient contact the pharmacy for the refill. If patient does not wish to contact the pharmacy document the reason why and proceed with request.) (Agent: If yes, when and what did the pharmacy advise?)  This is the patient's preferred pharmacy:  [facility] [address], [place] - [address] [place] [address] Phone: [phone] Fax: [phone]  Is this the correct pharmacy for this prescription? Yes If no, delete pharmacy and type the correct one.   Has the prescription been filled recently? Yes  Is the patient out of the medication? Yes Patient requesting a 3 month supply  Has the patient been seen for an appointment in the last year OR does the patient have an upcoming appointment? Yes  Can we respond through MyChart? Yes  Agent: Please be advised that Rx refills may take up to 3 business days. We ask that you follow-up with your pharmacy.

## 2024-06-09 NOTE — Telephone Encounter (Signed)
 Pt scheduled to be seen on 06/17/24

## 2024-06-16 ENCOUNTER — Other Ambulatory Visit: Payer: Self-pay | Admitting: Nurse Practitioner

## 2024-06-17 ENCOUNTER — Ambulatory Visit: Admitting: Nurse Practitioner

## 2024-08-03 ENCOUNTER — Other Ambulatory Visit (INDEPENDENT_AMBULATORY_CARE_PROVIDER_SITE_OTHER): Payer: Self-pay

## 2024-08-03 DIAGNOSIS — E781 Pure hyperglyceridemia: Secondary | ICD-10-CM

## 2024-08-03 DIAGNOSIS — E119 Type 2 diabetes mellitus without complications: Secondary | ICD-10-CM

## 2024-08-03 DIAGNOSIS — Z7985 Long-term (current) use of injectable non-insulin antidiabetic drugs: Secondary | ICD-10-CM

## 2024-08-03 LAB — LIPID PANEL
Cholesterol: 163 mg/dL (ref 0–200)
HDL: 45.6 mg/dL (ref >39.00)
LDL Cholesterol: 101 mg/dL — ABNORMAL HIGH (ref 0–99)
NonHDL: 117.67
Total CHOL/HDL Ratio: 4
Triglycerides: 83 mg/dL (ref 0.0–149.0)
VLDL: 16.6 mg/dL (ref 0.0–40.0)

## 2024-08-03 LAB — COMPREHENSIVE METABOLIC PANEL WITH GFR
ALT: 15 U/L (ref 0–35)
AST: 12 U/L (ref 0–37)
Albumin: 4.1 g/dL (ref 3.5–5.2)
Alkaline Phosphatase: 132 U/L — ABNORMAL HIGH (ref 39–117)
BUN: 8 mg/dL (ref 6–23)
CO2: 25 meq/L (ref 19–32)
Calcium: 9 mg/dL (ref 8.4–10.5)
Chloride: 101 meq/L (ref 96–112)
Creatinine, Ser: 0.75 mg/dL (ref 0.40–1.20)
GFR: 105.14 mL/min (ref >60.00)
Glucose, Bld: 96 mg/dL (ref 70–99)
Potassium: 4.3 meq/L (ref 3.5–5.1)
Sodium: 134 meq/L — ABNORMAL LOW (ref 135–145)
Total Bilirubin: 0.5 mg/dL (ref 0.2–1.2)
Total Protein: 7.9 g/dL (ref 6.0–8.3)

## 2024-08-03 LAB — CBC WITH DIFFERENTIAL/PLATELET
Basophils Absolute: 0.1 K/uL (ref 0.0–0.1)
Basophils Relative: 0.5 % (ref 0.0–3.0)
Eosinophils Absolute: 0.2 K/uL (ref 0.0–0.7)
Eosinophils Relative: 1.6 % (ref 0.0–5.0)
HCT: 37.9 % (ref 36.0–46.0)
Hemoglobin: 12.3 g/dL (ref 12.0–15.0)
Lymphocytes Relative: 30.5 % (ref 12.0–46.0)
Lymphs Abs: 3.1 K/uL (ref 0.7–4.0)
MCHC: 32.5 g/dL (ref 30.0–36.0)
MCV: 74.4 fl — ABNORMAL LOW (ref 78.0–100.0)
Monocytes Absolute: 0.4 K/uL (ref 0.1–1.0)
Monocytes Relative: 4.1 % (ref 3.0–12.0)
Neutro Abs: 6.4 K/uL (ref 1.4–7.7)
Neutrophils Relative %: 63.3 % (ref 43.0–77.0)
Platelets: 315 K/uL (ref 150.0–400.0)
RBC: 5.1 Mil/uL (ref 3.87–5.11)
RDW: 16.8 % — ABNORMAL HIGH (ref 11.5–15.5)
WBC: 10.1 K/uL (ref 4.0–10.5)

## 2024-08-03 LAB — HEMOGLOBIN A1C: Hgb A1c MFr Bld: 5.7 % (ref 4.6–6.5)

## 2024-08-25 ENCOUNTER — Ambulatory Visit: Payer: Self-pay | Admitting: Nurse Practitioner

## 2024-08-25 NOTE — Progress Notes (Signed)
 Please inform pt alkaline phosphate is still elevated but has decreased compared to previous lab.  We will recheck at next OV. LDL increased compared to previous lab. A1c improved to 5.7   She is due for office visit last visit was 08/14/23. Please scheduled OV.

## 2024-09-09 ENCOUNTER — Ambulatory Visit: Payer: Self-pay | Admitting: Nurse Practitioner

## 2024-09-09 ENCOUNTER — Telehealth: Payer: Self-pay

## 2024-09-09 DIAGNOSIS — Z7985 Long-term (current) use of injectable non-insulin antidiabetic drugs: Secondary | ICD-10-CM

## 2024-09-09 NOTE — Telephone Encounter (Signed)
 Attempted to call the Patient per Angel Cole and reschedule her appointment that she missed today. If Patient calls back please get her scheduled.

## 2024-09-10 NOTE — Telephone Encounter (Signed)
 Called Patient to reschedule appointment she missed on 09/09/24 and she said she will call back when she gets off of work.

## 2024-09-14 ENCOUNTER — Encounter: Payer: Self-pay | Admitting: Nurse Practitioner
